# Patient Record
Sex: Male | Born: 1940 | Race: White | Hispanic: No | Marital: Married | Smoking: Former smoker
Health system: Southern US, Community
[De-identification: ages and names within clinical notes are randomized; demographics above are authoritative.]

## PROBLEM LIST (undated history)

## (undated) DIAGNOSIS — I447 Left bundle-branch block, unspecified: Secondary | ICD-10-CM

## (undated) DIAGNOSIS — I4891 Unspecified atrial fibrillation: Secondary | ICD-10-CM

## (undated) DIAGNOSIS — G4730 Sleep apnea, unspecified: Secondary | ICD-10-CM

## (undated) DIAGNOSIS — E785 Hyperlipidemia, unspecified: Secondary | ICD-10-CM

## (undated) DIAGNOSIS — R42 Dizziness and giddiness: Secondary | ICD-10-CM

## (undated) DIAGNOSIS — R351 Nocturia: Secondary | ICD-10-CM

## (undated) DIAGNOSIS — M1990 Unspecified osteoarthritis, unspecified site: Secondary | ICD-10-CM

## (undated) DIAGNOSIS — N210 Calculus in bladder: Secondary | ICD-10-CM

## (undated) DIAGNOSIS — I10 Essential (primary) hypertension: Secondary | ICD-10-CM

## (undated) DIAGNOSIS — E7800 Pure hypercholesterolemia, unspecified: Secondary | ICD-10-CM

## (undated) DIAGNOSIS — I429 Cardiomyopathy, unspecified: Secondary | ICD-10-CM

## (undated) HISTORY — DX: Hyperlipidemia, unspecified: E78.5

## (undated) HISTORY — DX: Dizziness and giddiness: R42

## (undated) HISTORY — DX: Unspecified atrial fibrillation: I48.91

## (undated) HISTORY — DX: Left bundle-branch block, unspecified: I44.7

## (undated) HISTORY — DX: Nocturia: R35.1

## (undated) HISTORY — PX: TOTAL HIP ARTHROPLASTY: SHX124

## (undated) HISTORY — PX: HERNIA REPAIR: SHX51

## (undated) HISTORY — DX: Unspecified osteoarthritis, unspecified site: M19.90

---

## 2000-10-31 ENCOUNTER — Encounter: Payer: Self-pay | Admitting: General Surgery

## 2000-11-07 ENCOUNTER — Ambulatory Visit (HOSPITAL_COMMUNITY): Admission: RE | Admit: 2000-11-07 | Discharge: 2000-11-07 | Payer: Self-pay | Admitting: General Surgery

## 2001-09-08 ENCOUNTER — Ambulatory Visit (HOSPITAL_COMMUNITY): Admission: RE | Admit: 2001-09-08 | Discharge: 2001-09-08 | Payer: Self-pay | Admitting: Cardiology

## 2002-10-05 ENCOUNTER — Ambulatory Visit (HOSPITAL_COMMUNITY): Admission: RE | Admit: 2002-10-05 | Discharge: 2002-10-05 | Payer: Self-pay | Admitting: Gastroenterology

## 2010-11-23 ENCOUNTER — Other Ambulatory Visit: Payer: Self-pay | Admitting: Orthopaedic Surgery

## 2010-11-23 ENCOUNTER — Encounter (HOSPITAL_COMMUNITY): Payer: BC Managed Care – PPO

## 2010-11-23 ENCOUNTER — Ambulatory Visit (HOSPITAL_COMMUNITY)
Admission: RE | Admit: 2010-11-23 | Discharge: 2010-11-23 | Disposition: A | Discharge status: Home / Self Care | Payer: BC Managed Care – PPO | Source: Ambulatory Visit | Attending: Orthopaedic Surgery | Admitting: Orthopaedic Surgery

## 2010-11-23 ENCOUNTER — Other Ambulatory Visit (HOSPITAL_COMMUNITY): Payer: Self-pay | Admitting: Orthopaedic Surgery

## 2010-11-23 DIAGNOSIS — M1610 Unilateral primary osteoarthritis, unspecified hip: Secondary | ICD-10-CM | POA: Insufficient documentation

## 2010-11-23 DIAGNOSIS — Z01810 Encounter for preprocedural cardiovascular examination: Secondary | ICD-10-CM | POA: Insufficient documentation

## 2010-11-23 DIAGNOSIS — Z01812 Encounter for preprocedural laboratory examination: Secondary | ICD-10-CM | POA: Insufficient documentation

## 2010-11-23 DIAGNOSIS — Z01811 Encounter for preprocedural respiratory examination: Secondary | ICD-10-CM

## 2010-11-23 DIAGNOSIS — Z01818 Encounter for other preprocedural examination: Secondary | ICD-10-CM | POA: Insufficient documentation

## 2010-11-23 DIAGNOSIS — R9431 Abnormal electrocardiogram [ECG] [EKG]: Secondary | ICD-10-CM | POA: Insufficient documentation

## 2010-11-23 DIAGNOSIS — Z79899 Other long term (current) drug therapy: Secondary | ICD-10-CM | POA: Insufficient documentation

## 2010-11-23 DIAGNOSIS — I10 Essential (primary) hypertension: Secondary | ICD-10-CM | POA: Insufficient documentation

## 2010-11-23 DIAGNOSIS — Z7982 Long term (current) use of aspirin: Secondary | ICD-10-CM | POA: Insufficient documentation

## 2010-11-23 DIAGNOSIS — M169 Osteoarthritis of hip, unspecified: Secondary | ICD-10-CM | POA: Insufficient documentation

## 2010-11-23 DIAGNOSIS — R7309 Other abnormal glucose: Secondary | ICD-10-CM | POA: Insufficient documentation

## 2010-11-23 LAB — BASIC METABOLIC PANEL
BUN: 21 mg/dL (ref 6–23)
CO2: 25 mEq/L (ref 19–32)
Calcium: 9.2 mg/dL (ref 8.4–10.5)
Chloride: 101 mEq/L (ref 96–112)
Creatinine, Ser: 1.03 mg/dL (ref 0.50–1.35)
GFR calc Af Amer: >60 mL/min (ref >60)
GFR calc non Af Amer: >60 mL/min (ref >60)
Glucose, Bld: 103 mg/dL — ABNORMAL HIGH (ref 70–99)
Potassium: 4 mEq/L (ref 3.5–5.1)
Sodium: 136 mEq/L (ref 135–145)

## 2010-11-23 LAB — URINALYSIS, ROUTINE W REFLEX MICROSCOPIC
Bilirubin Urine: NEGATIVE
Glucose, UA: NEGATIVE mg/dL
Hgb urine dipstick: NEGATIVE
Ketones, ur: NEGATIVE mg/dL
Leukocytes, UA: NEGATIVE
Nitrite: NEGATIVE
Protein, ur: NEGATIVE mg/dL
Specific Gravity, Urine: 1.022 (ref 1.005–1.030)
Urobilinogen, UA: 0.2 mg/dL (ref 0.0–1.0)
pH: 6.5 (ref 5.0–8.0)

## 2010-11-23 LAB — PROTIME-INR
INR: 1.1 (ref 0.00–1.49)
Prothrombin Time: 14.4 seconds (ref 11.6–15.2)

## 2010-11-23 LAB — DIFFERENTIAL
Basophils Absolute: 0 10*3/uL (ref 0.0–0.1)
Basophils Relative: 1 % (ref 0–1)
Eosinophils Absolute: 0.5 10*3/uL (ref 0.0–0.7)
Eosinophils Relative: 8 % — ABNORMAL HIGH (ref 0–5)
Lymphocytes Relative: 23 % (ref 12–46)
Lymphs Abs: 1.5 10*3/uL (ref 0.7–4.0)
Monocytes Absolute: 0.7 10*3/uL (ref 0.1–1.0)
Monocytes Relative: 10 % (ref 3–12)
Neutro Abs: 3.8 10*3/uL (ref 1.7–7.7)
Neutrophils Relative %: 59 % (ref 43–77)

## 2010-11-23 LAB — CBC
HCT: 41.1 % (ref 39.0–52.0)
Hemoglobin: 13.7 g/dL (ref 13.0–17.0)
MCH: 30.4 pg (ref 26.0–34.0)
MCHC: 33.3 g/dL (ref 30.0–36.0)
MCV: 91.3 fL (ref 78.0–100.0)
Platelets: 191 10*3/uL (ref 150–400)
RBC: 4.5 MIL/uL (ref 4.22–5.81)
RDW: 12.6 % (ref 11.5–15.5)
WBC: 6.6 10*3/uL (ref 4.0–10.5)

## 2010-11-30 ENCOUNTER — Inpatient Hospital Stay (HOSPITAL_COMMUNITY): Payer: BC Managed Care – PPO

## 2010-11-30 ENCOUNTER — Inpatient Hospital Stay (HOSPITAL_COMMUNITY)
Admission: RE | Admit: 2010-11-30 | Discharge: 2010-12-03 | DRG: 818 | Disposition: A | Discharge status: Home Health | Payer: BC Managed Care – PPO | Source: Ambulatory Visit | Attending: Orthopaedic Surgery | Admitting: Orthopaedic Surgery

## 2010-11-30 DIAGNOSIS — I10 Essential (primary) hypertension: Secondary | ICD-10-CM | POA: Diagnosis present

## 2010-11-30 DIAGNOSIS — M169 Osteoarthritis of hip, unspecified: Principal | ICD-10-CM | POA: Diagnosis present

## 2010-11-30 DIAGNOSIS — M1610 Unilateral primary osteoarthritis, unspecified hip: Principal | ICD-10-CM | POA: Diagnosis present

## 2010-11-30 DIAGNOSIS — I447 Left bundle-branch block, unspecified: Secondary | ICD-10-CM | POA: Diagnosis present

## 2010-11-30 DIAGNOSIS — D62 Acute posthemorrhagic anemia: Secondary | ICD-10-CM | POA: Diagnosis not present

## 2010-11-30 DIAGNOSIS — Z01812 Encounter for preprocedural laboratory examination: Secondary | ICD-10-CM

## 2010-11-30 DIAGNOSIS — Z96649 Presence of unspecified artificial hip joint: Secondary | ICD-10-CM

## 2010-11-30 LAB — ABO/RH: ABO/RH(D): O POS

## 2010-12-01 LAB — BASIC METABOLIC PANEL
CO2: 26 mEq/L (ref 19–32)
Calcium: 7.9 mg/dL — ABNORMAL LOW (ref 8.4–10.5)
Chloride: 102 mEq/L (ref 96–112)
Sodium: 134 mEq/L — ABNORMAL LOW (ref 135–145)

## 2010-12-01 LAB — CBC
Platelets: 142 10*3/uL — ABNORMAL LOW (ref 150–400)
RBC: 2.99 MIL/uL — ABNORMAL LOW (ref 4.22–5.81)
WBC: 9 10*3/uL (ref 4.0–10.5)

## 2010-12-02 LAB — BASIC METABOLIC PANEL
CO2: 27 mEq/L (ref 19–32)
Chloride: 98 mEq/L (ref 96–112)
Creatinine, Ser: 1.2 mg/dL (ref 0.50–1.35)

## 2010-12-02 LAB — CBC
HCT: 24.8 % — ABNORMAL LOW (ref 39.0–52.0)
MCV: 92.2 fL (ref 78.0–100.0)
Platelets: 135 10*3/uL — ABNORMAL LOW (ref 150–400)
RBC: 2.69 MIL/uL — ABNORMAL LOW (ref 4.22–5.81)
WBC: 8.1 10*3/uL (ref 4.0–10.5)

## 2010-12-03 LAB — BASIC METABOLIC PANEL
BUN: 22 mg/dL (ref 6–23)
CO2: 27 mEq/L (ref 19–32)
Chloride: 97 mEq/L (ref 96–112)
Creatinine, Ser: 1.33 mg/dL (ref 0.50–1.35)
Glucose, Bld: 120 mg/dL — ABNORMAL HIGH (ref 70–99)

## 2010-12-03 LAB — CBC
HCT: 23.9 % — ABNORMAL LOW (ref 39.0–52.0)
Hemoglobin: 8.2 g/dL — ABNORMAL LOW (ref 13.0–17.0)
MCV: 91.9 fL (ref 78.0–100.0)
RDW: 12.3 % (ref 11.5–15.5)
WBC: 9.4 10*3/uL (ref 4.0–10.5)

## 2010-12-04 LAB — TYPE AND SCREEN: Unit division: 0

## 2010-12-04 NOTE — H&P (Signed)
  NAMEMOURAD, CWIKLA NO.:  [id number]  MEDICAL RECORD NO.:  [id number]  LOCATION:  1607                         FACILITY:  [facility]  PHYSICIAN:  [name], M.D.DATE OF BIRTH:  [birth year]  DATE OF ADMISSION:  11/30/2010 DATE OF DISCHARGE:                             HISTORY & PHYSICAL   CHIEF COMPLAINT:  Right hip pain.  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 Chaney Malling and has had no problems at all with that hip.  His right hip has been having significant pain.  He has had decreased activities in the right hip.  It started to hurt him to the point where it is affecting his activities of daily living, is affecting sports.  He plays a lot of sports.  He is an otherwise healthy individual.  He is having a lot of groin pain as well.  He does which at this point to proceed with a total hip arthroplasty.  He understands the risks and benefits of this and what this involves and he is going to the operating room today for the surgery.  PAST MEDICAL HISTORY: 1. High blood pressure. 2. Arthritis.  MEDICATIONS:  Lisinopril, Coreg, Lipitor, aspirin.  ALLERGIES:  No known drug allergies.  SOCIAL HISTORY:  Again, he is a professor at [employer].  He does not smoke, and drinks alcohol occasionally.  REVIEW OF SYSTEMS:  Negative for chest pain, shortness of breath, fever, chills, nausea, and vomiting.  PHYSICAL EXAMINATION:  VITAL SIGNS:  He is afebrile with stable vital signs. GENERAL:  He is alert and oriented x3, in no acute distress. HEENT:  Normocephalic, atraumatic.  Pupils equal, round, and reactive to light. NECK:  Supple. LUNGS:  Clear to auscultation bilaterally. HEART:  Regular rate and rhythm. ABDOMEN:  Benign. EXTREMITIES:  His leg lengths appear near equal.  He has pain with internal and external rotation of the right hip.  X-rays  reviewed and show near end-stage arthritis of the right hip.  His left hip is showing eccentric wear of the polyethylene.  ASSESSMENT:  Osteoarthritis, right hip.  PLAN:  We will proceed with a right total hip arthroplasty today.  He again understands the risks and benefits of this and what it involves and he does wish to proceed with surgery.     [name], M.D.     CYB/MEDQ  D:  11/30/2010  T:  12/01/2010  Job:  [id number]  Electronically Signed by Doneen Poisson M.D. on 12/04/2010 12:26:59 PM

## 2010-12-04 NOTE — Discharge Summary (Signed)
  NAMESAKETH, DAUBERT NO.:  [id number]  MEDICAL RECORD NO.:  [id number]  LOCATION:  1607                         FACILITY:  [facility]  PHYSICIAN:  [name], M.D.DATE OF BIRTH:  07-15-1940  DATE OF ADMISSION:  11/30/2010 DATE OF DISCHARGE:  12/03/2010                              DISCHARGE SUMMARY   ADMITTING DIAGNOSIS:  Severe osteoarthritis and degenerative disease, right hip.  DISCHARGE DIAGNOSIS:  Severe osteoarthritis and degenerative disease, right hip.  PROCEDURE:  Right total hip arthroplasty through direct anterior approach on November 30, 2010.  HOSPITAL COURSE:  Mr. Hallowell is a 70 year old gentleman who has debilitating arthritis above his right hip.  He was admitted to the inpatient on November 30, 2010, after an elective total hip arthroplasty that was performed without complications.  Postoperatively on the floor, he did have an episode of acute blood loss anemia that did require transfusion.  Otherwise, his hospital course was uneventful.  By the day of discharge, he was tolerating a regular diet and oral pain medications and it was felt he could be discharged safely to home.  DISPOSITION:  Discharged to home.  DISCHARGE INSTRUCTIONS:  While he is at home, he can get his incision wet in shower.  He will follow up with my office in 2 weeks.  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  DISCHARGE MEDICATIONS: 1. Xarelto. 2. Percocet. 3. Robaxin. 4. He will continue on his same medications at home as before.     [name], M.D.     CYB/MEDQ  D:  12/03/2010  T:  12/03/2010  Job:  [id number]  Electronically Signed by Doneen Poisson M.D. on 12/04/2010 12:27:04 PM

## 2010-12-04 NOTE — Op Note (Signed)
NAMEVALENTINO, Cain NO.:  [id number]  MEDICAL RECORD NO.:  [id number]  LOCATION:  1607                         FACILITY:  [facility]  PHYSICIAN:  [name], M.D.DATE OF BIRTH:  10-30-1940  DATE OF PROCEDURE:  11/30/2010 DATE OF DISCHARGE:                              OPERATIVE REPORT   PREOPERATIVE DIAGNOSES:  Severe osteoarthritis and degenerative joint disease, right hip.  POSTOPERATIVE DIAGNOSES:  Severe osteoarthritis and degenerative joint disease, right hip.  PROCEDURE:  Right total hip arthroplasty through direct anterior approach.  IMPLANTS:  DePuy Pinnacle Sector cup/acetabular component size 58, size 10 Corail femoral component with collar and HA coating and standard offset, size 36 +1.5 ceramic femoral head, neutral +4 36 polyethylene liner.  SURGEON:  [name], M.D.  ANESTHESIA:  General.  ANTIBIOTICS:  2 g IV Ancef.  ESTIMATED BLOOD LOSS:  1000 cc.  COMPLICATIONS:  None.  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  He has had previous left total hip arthroplasty at least 15+ years ago.  The right has bothered him severely.  His x-rays showed end-stage arthritis and he wished to proceed with a total hip arthroplasty.  I explained the risks and benefits of the surgery to him including the risks, acute blood loss anemia, DVT, and PE, and the fact that I will be doing this under direct anterior approach.  He does understand this and wished to proceed with the surgery.  PROCEDURE DESCRIPTION:  After informed consent was obtained, appropriate right hip was marked.  He was brought to the operating room and general anesthesia was obtained while he was on a stretcher.  A Foley catheter was placed and then both legs were placed in traction boots.  He was then placed on the Hana fracture table with perineal post in place as well.  Both legs were  placed in traction, but no traction was applied. His right hip was then assessed under direct fluoroscopy and I put the C- arm unit to assess this hip center and assess the hip in general.  We then prepped the right hip with DuraPrep and sterile drapes.  A time-out was called to identify the correct patient and correct right hip.  I then made an incision 1 cm distal and 3 cm posterior to the anterosuperior iliac supine and carried this down the leg.  I dissected down to the tensor fascia lata and then I divided the tensor fascia lata longitudinally.  I proceeded with the direct anterior approach of the hip.  Cobra retractor was placed around the lateral neck and then up under the rectus femoris, Cobra retractor was placed medial.  I was able to divide the hip capsule after cauterizing the lateral femoral circumflex vessels.  Once the hip capsule was divided, there was a large amount of synovial fluid that was drained.  I then placed the Cobra retractors within the hip capsule under direct fluoroscopic guidance, I accessed my level neck cut, then I made the neck cut with an oscillating saw under direct visualization.  Using a corkscrew guide, I removed the femoral head in its entirety.  I then cleaned the  acetabulum of debris including the labrum remnants and placed a Hohmann retractor anterior and a Cobra posterior.  I then began reaming from size 46 up to size 57 reamer with all the last few reamers placed under direct fluoroscopic guidance.  I then chose a size 58 femoral component and then placed this under direct fluoroscopic guidance, again in direct visualization, and this is a 58 Pinnacle Sector cup with 3 screw holes, but I did not need to place the screw holes.  I felt comfortable anteversion and inclination based on visualization and the direct fluoroscopy. Attention was then turned to the femur.  No traction was applied to the leg.  The leg was externally rotated to 90 degrees,  extended and adducted down to leg.  This allowed me to gain access to the femoral canal.  I did release the lateral capsule and piriformis to bring the femur more anterior.  I then used a cookie cutter guide and then began broaching from a size 8 broach to a size 10, and surprisingly the size 10 was stable.  He had very strong cortices.  I placed a trial standard neck and a trail 36 +1.5 femoral head and reduced this into acetabulum, bring the leg back up flexed with traction and internal rotation.  I put him through internal and external rotation and it was stable.  There was minimal shuck.  Under direct fluoroscopic guidance, the leg lengths were near equal.  We then dislocate the hip, removed all trial components and placed the real HA-coated femoral component size 10 with a collar.  The real 36 + 1.5 femoral head as well.  We then reduced his back in the acetabulum that was stable.  I already placed the polyethylene liner in the socket as well.  Under direct fluoroscopic guidance, the hip again was stable.  We copiously irrigated the tissues.  Of note, he did have robust bleeding throughout the case.  He lost about 1000 cc of blood. We then closed the hip capsule with interrupted #1 Ethibond suture followed by reapproximated the tensor fascia lata with running #1 Vicryl suture, 2-0 in the subcutaneous tissue, and 4-0 Monocryl and subcuticular tissue with Steri-Strips applied.  Well padded sterile dressing was applied.  He was awakened, extubated, and taken to the recovery room in stable condition.  All final counts were correct and there were no complications noted.     [name], M.D.     CYB/MEDQ  D:  11/30/2010  T:  12/01/2010  Job:  [id number]  Electronically Signed by Doneen Poisson M.D. on 12/04/2010 12:27:02 PM

## 2014-02-16 ENCOUNTER — Ambulatory Visit (INDEPENDENT_AMBULATORY_CARE_PROVIDER_SITE_OTHER): Payer: BC Managed Care – PPO | Admitting: Emergency Medicine

## 2014-02-16 VITALS — BP 118/68 | HR 76 | Temp 98.0°F | Resp 17 | Ht 71.0 in | Wt 196.0 lb

## 2014-02-16 DIAGNOSIS — L255 Unspecified contact dermatitis due to plants, except food: Secondary | ICD-10-CM

## 2014-02-16 MED ORDER — TRIAMCINOLONE ACETONIDE 0.1 % EX CREA: 1.0000 "application " | TOPICAL_CREAM | Freq: Two times a day (BID) | CUTANEOUS | Status: DC

## 2014-02-16 NOTE — Patient Instructions (Signed)

## 2014-02-16 NOTE — Progress Notes (Signed)
[facility] [address], [place] [address] [phone]  Date:  02/16/2014   Name:  Daniel Cain   DOB:  Aug 22, 1940   MRN:  [id number]  PCP:  No primary provider on file.    Chief Complaint: Poison Ivy and Immunizations   History of Present Illness:  OSMIN WELZ is a 73 y.o. 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   Not responding to OTC measures as well as he would like. No improvement with over the counter medications or other home remedies. Denies other complaint or health concern today.   There are no active problems to display for this patient.   History reviewed. No pertinent past medical history.  History reviewed. No pertinent past surgical history.  History  Substance Use Topics  . Smoking status: Never Smoker   . Smokeless tobacco: Not on file  . Alcohol Use: No    Family History  Problem Relation Age of Onset  . Heart disease Mother   . Hypertension Father     No Known Allergies  Medication list has been reviewed and updated.  No current outpatient prescriptions on file prior to visit.   No current facility-administered medications on file prior to visit.    Review of Systems:  As per HPI, otherwise negative.    Physical Examination: Filed Vitals:   02/16/14 1458  BP: 118/68  Pulse: 76  Temp: 98 F (36.7 C)  Resp: 17   Filed Vitals:   02/16/14 1458  Height:  (1.803 m)  Weight: 196 lb (88.905 kg)   Body mass index is 27.35 kg/(m^2). Ideal Body Weight: Weight in (lb) to have BMI = 25: 178.9   GEN: WDWN, NAD, Non-toxic, Alert & Oriented x 3 HEENT: Atraumatic, Normocephalic.  Ears and Nose: No external deformity. EXTR: No clubbing/cyanosis/edema NEURO: Normal gait.  PSYCH: Normally interactive. Conversant. Not depressed or anxious appearing.  Calm demeanor.  LEGS:  Contact dermatitis   Assessment and Plan: Poison  ivy TAC  Signed,  Phillips Odor, MD

## 2014-07-16 ENCOUNTER — Encounter (HOSPITAL_COMMUNITY): Payer: Self-pay | Admitting: Emergency Medicine

## 2014-07-16 ENCOUNTER — Emergency Department (HOSPITAL_COMMUNITY)
Admission: EM | Admit: 2014-07-16 | Discharge: 2014-07-16 | Disposition: A | Discharge status: Home / Self Care | Payer: BC Managed Care – PPO | Attending: Emergency Medicine | Admitting: Emergency Medicine

## 2014-07-16 DIAGNOSIS — E780 Pure hypercholesterolemia: Secondary | ICD-10-CM | POA: Insufficient documentation

## 2014-07-16 DIAGNOSIS — R112 Nausea with vomiting, unspecified: Secondary | ICD-10-CM | POA: Diagnosis not present

## 2014-07-16 DIAGNOSIS — R531 Weakness: Secondary | ICD-10-CM

## 2014-07-16 DIAGNOSIS — R42 Dizziness and giddiness: Secondary | ICD-10-CM | POA: Diagnosis present

## 2014-07-16 DIAGNOSIS — I10 Essential (primary) hypertension: Secondary | ICD-10-CM | POA: Insufficient documentation

## 2014-07-16 DIAGNOSIS — Z79899 Other long term (current) drug therapy: Secondary | ICD-10-CM | POA: Diagnosis not present

## 2014-07-16 DIAGNOSIS — Z7982 Long term (current) use of aspirin: Secondary | ICD-10-CM | POA: Diagnosis not present

## 2014-07-16 HISTORY — DX: Pure hypercholesterolemia, unspecified: E78.00

## 2014-07-16 HISTORY — DX: Essential (primary) hypertension: I10

## 2014-07-16 LAB — CBC WITH DIFFERENTIAL/PLATELET
BASOS PCT: 0 % (ref 0–1)
Basophils Absolute: 0 10*3/uL (ref 0.0–0.1)
EOS ABS: 0.3 10*3/uL (ref 0.0–0.7)
EOS PCT: 4 % (ref 0–5)
HCT: 42.7 % (ref 39.0–52.0)
HEMOGLOBIN: 14.3 g/dL (ref 13.0–17.0)
LYMPHS ABS: 0.9 10*3/uL (ref 0.7–4.0)
Lymphocytes Relative: 13 % (ref 12–46)
MCH: 31.6 pg (ref 26.0–34.0)
MCHC: 33.5 g/dL (ref 30.0–36.0)
MCV: 94.5 fL (ref 78.0–100.0)
Monocytes Absolute: 0.5 10*3/uL (ref 0.1–1.0)
Monocytes Relative: 8 % (ref 3–12)
NEUTROS ABS: 5.3 10*3/uL (ref 1.7–7.7)
NEUTROS PCT: 75 % (ref 43–77)
PLATELETS: 197 10*3/uL (ref 150–400)
RBC: 4.52 MIL/uL (ref 4.22–5.81)
RDW: 12.2 % (ref 11.5–15.5)
WBC: 7 10*3/uL (ref 4.0–10.5)

## 2014-07-16 LAB — COMPREHENSIVE METABOLIC PANEL
ALBUMIN: 4.4 g/dL (ref 3.5–5.2)
ALK PHOS: 57 U/L (ref 39–117)
ALT: 24 U/L (ref 0–53)
AST: 25 U/L (ref 0–37)
Anion gap: 7 (ref 5–15)
BUN: 19 mg/dL (ref 6–23)
CALCIUM: 9.2 mg/dL (ref 8.4–10.5)
CHLORIDE: 104 mmol/L (ref 96–112)
CO2: 27 mmol/L (ref 19–32)
Creatinine, Ser: 1.02 mg/dL (ref 0.50–1.35)
GFR calc non Af Amer: 71 mL/min — ABNORMAL LOW (ref >90)
GFR, EST AFRICAN AMERICAN: 83 mL/min — AB (ref >90)
GLUCOSE: 123 mg/dL — AB (ref 70–99)
POTASSIUM: 4.3 mmol/L (ref 3.5–5.1)
SODIUM: 138 mmol/L (ref 135–145)
Total Bilirubin: 1.2 mg/dL (ref 0.3–1.2)
Total Protein: 6.7 g/dL (ref 6.0–8.3)

## 2014-07-16 LAB — TROPONIN I: Troponin I: <0.03 ng/mL (ref <0.031)

## 2014-07-16 NOTE — ED Notes (Signed)
Pt alert, oriented, and ambulatory upon DC. He was advised to limit his alcohol consumption and follow up with PCP Daniel Cain in the next week.

## 2014-07-16 NOTE — ED Notes (Signed)
MD at [facility] at bedside. 

## 2014-07-16 NOTE — ED Provider Notes (Addendum)
CSN: [id number]     Arrival date & time 07/16/14  1510 History   First MD Initiated Contact with Patient 07/16/14 1527     Chief Complaint  Patient presents with  . Dizziness  . Nausea     (Consider location/radiation/quality/duration/timing/severity/associated sxs/prior Treatment) HPI Patient awakened this morning at 9 AM feeling "foggy", meaning difficulty concentrating and lightheadedness and nauseated. He vomited one time. No hematemesis He no longer feels nauseated and "fogginess" feels improved. He denies shortness of breath denies chest pain denies abdominal pain denies headache no other associated symptoms. He's had 4 similar episodes in the past month. No prior medical treatment or evaluation for this complaint. Nothing makes symptoms better or worse. No other associated symptoms Past Medical History  Diagnosis Date  . Hypertension   . Hypercholesteremia    History reviewed. No pertinent past surgical history. Family History  Problem Relation Age of Onset  . Heart disease Mother   . Hypertension Father    History  Substance Use Topics  . Smoking status: Never Smoker   . Smokeless tobacco: Not on file  . Alcohol Use: Yes     Comment: social   no illicit drug use  Review of Systems  Gastrointestinal: Positive for nausea and vomiting.  Neurological: Positive for light-headedness.  All other systems reviewed and are negative.     Allergies  Review of patient's allergies indicates no known allergies.  Home Medications   Prior to Admission medications   Medication Sig Start Date End Date Taking? Authorizing Provider  aspirin 81 MG tablet Take 81 mg by mouth daily.    Historical Provider, MD  lisinopril (PRINIVIL,ZESTRIL) 10 MG tablet Take 10 mg by mouth daily.    Historical Provider, MD  rosuvastatin (CRESTOR) 10 MG tablet Take 10 mg by mouth daily.    Historical Provider, MD  triamcinolone cream (KENALOG) 0.1 % Apply 1 application topically 2 (two) times daily.  02/16/14   Carmelina DaneJeffery S Anderson, MD   BP 156/84 mmHg  Pulse 55  Temp(Src) 97.6 F (36.4 C) (Oral)  Resp 18  SpO2 100% Physical Exam  Constitutional: He is oriented to person, place, and time. He appears well-developed and well-nourished. No distress.  HENT:  Head: Normocephalic and atraumatic.  Eyes: Conjunctivae are normal. Pupils are equal, round, and reactive to light.  Neck: Neck supple. No tracheal deviation present. No thyromegaly present.  Cardiovascular: Normal rate, regular rhythm and normal heart sounds.   No murmur heard. Pulmonary/Chest: Effort normal and breath sounds normal.  Abdominal: Soft. Bowel sounds are normal. He exhibits no distension and no mass. There is no tenderness.  Musculoskeletal: Normal range of motion. He exhibits no edema or tenderness.  Neurological: He is alert and oriented to person, place, and time. No cranial nerve deficit. Coordination normal.  Motor strength 5 over 5 overall gait normal  Skin: Skin is warm and dry. No rash noted.  Psychiatric: He has a normal mood and affect.  Nursing note and vitals reviewed.   ED Course  Procedures (including critical care time) Labs Review Labs Reviewed  I-STAT CHEM 8, ED    Imaging Review No results found.   EKG Interpretation   Date/Time:  Saturday July 16 2014 15:43:54 EST Ventricular Rate:  53 PR Interval:  152 QRS Duration: 154 QT Interval:  496 QTC Calculation: 466 R Axis:   41 Text Interpretation:  Sinus rhythm Left bundle branch block No significant  change since last tracing Confirmed by Mirian MoGentry, Matthew [phone]([id number]) on  07/16/2014 3:46:46 PM     4:55 PM patient resting comfortably. Asymptomatic Glasgow Coma Score 15 Results for orders placed or performed during the hospital encounter of 07/16/14  Comprehensive metabolic panel  Result Value Ref Range   Sodium 138 135 - 145 mmol/L   Potassium 4.3 3.5 - 5.1 mmol/L   Chloride 104 96 - 112 mmol/L   CO2 27 19 - 32 mmol/L   Glucose,  Bld 123 (H) 70 - 99 mg/dL   BUN 19 6 - 23 mg/dL   Creatinine, Ser 6.04 0.50 - 1.35 mg/dL   Calcium 9.2 8.4 - 54.0 mg/dL   Total Protein 6.7 6.0 - 8.3 g/dL   Albumin 4.4 3.5 - 5.2 g/dL   AST 25 0 - 37 U/L   ALT 24 0 - 53 U/L   Alkaline Phosphatase 57 39 - 117 U/L   Total Bilirubin 1.2 0.3 - 1.2 mg/dL   GFR calc non Af Amer 71 (L) >90 mL/min   GFR calc Af Amer 83 (L) >90 mL/min   Anion gap 7 5 - 15  CBC with Differential/Platelet  Result Value Ref Range   WBC 7.0 4.0 - 10.5 K/uL   RBC 4.52 4.22 - 5.81 MIL/uL   Hemoglobin 14.3 13.0 - 17.0 g/dL   HCT 98.1 19.1 - 47.8 %   MCV 94.5 78.0 - 100.0 fL   MCH 31.6 26.0 - 34.0 pg   MCHC 33.5 30.0 - 36.0 g/dL   RDW 29.5 62.1 - 30.8 %   Platelets 197 150 - 400 K/uL   Neutrophils Relative % 75 43 - 77 %   Neutro Abs 5.3 1.7 - 7.7 K/uL   Lymphocytes Relative 13 12 - 46 %   Lymphs Abs 0.9 0.7 - 4.0 K/uL   Monocytes Relative 8 3 - 12 %   Monocytes Absolute 0.5 0.1 - 1.0 K/uL   Eosinophils Relative 4 0 - 5 %   Eosinophils Absolute 0.3 0.0 - 0.7 K/uL   Basophils Relative 0 0 - 1 %   Basophils Absolute 0.0 0.0 - 0.1 K/uL  Troponin I  Result Value Ref Range   Troponin I <0.03 <0.031 ng/mL   No results found.  MDM  I doubt cardiac etiology patient reports that he plays basketball 3 times per week without exertional dyspnea. His exercise tolerance is not diminished. Patient and his wife disagree on how much he drinks. His wife states that he grossly under estimates his alcohol use. Patient admits to drinking alcohol approximately 2 glasses of wine 2 or 3 times per week. His wife states his alcohol use is much heavier, they have argued over his alcohol use and he drank heavily last night. I suggest the patient and his wife have a conversation with Dr. Georgia Dom alcohol use and he cut down on alcohol use. He may also benefit from exercise stress test. He has an appointment with Dr. Modesto Charon next week Final diagnoses:  None  Dx.#1 Weakness #2 nausea  and vomiting      Doug Sou, MD 07/16/14 1701  Doug Sou, MD 07/16/14 [phone]

## 2014-07-16 NOTE — ED Notes (Addendum)
Pt from home c/o episodes of lightheadedness and nausea for the past couple of month.  Pt denies blurred vision, headache, or pain of any type. Pt alert, oriented, and ambulatory with steady gait upon triage assessment.

## 2014-07-16 NOTE — Discharge Instructions (Signed)
Keep your scheduled appointment with Dr. Modesto CharonWong next week. We suggest that you limit alcohol use to 4 ounces of wine or one beer daily. We also suggest that you.ask Dr. Modesto CharonWong for cardiac stress test. Return if you feel worse for any reason

## 2014-08-29 NOTE — Progress Notes (Signed)
     HPI: 74 year old male for evaluation of weakness. Seen recently in the emergency room for dizziness. Laboratories in February 2016 showed BUN 19 and creatinine 1.02. Potassium 4.3. Hemoglobin 14.3. Patient denies dyspnea on exertion, orthopnea, PND, pedal edema, syncope or chest pain. Since January he has had 3 separate episodes of dizziness. He states it is not positional. It occurs suddenly and it is described as "being in a fog". No chest pain, palpitations or syncope. Symptoms last 1 hour and resolved. No associated weakness in his extremities, dysarthria.  Current Outpatient Prescriptions  Medication Sig Dispense Refill  . aspirin 81 MG tablet Take 81 mg by mouth daily.    Marland Kitchen. atorvastatin (LIPITOR) 80 MG tablet Take 1 tablet by mouth daily.    . carvedilol (COREG) 25 MG tablet Take 1 tablet by mouth 2 (two) times daily.    . clobetasol cream (TEMOVATE) 0.05 % Apply 1 application topically 2 (two) times daily.    Marland Kitchen. lisinopril (PRINIVIL,ZESTRIL) 20 MG tablet Take 1 tablet by mouth daily.     No current facility-administered medications for this visit.    No Known Allergies   Past Medical History  Diagnosis Date  . Hypertension   . Hypercholesteremia     Past Surgical History  Procedure Laterality Date  . Total hip arthroplasty Bilateral   . Hernia repair      History   Social History  . Marital Status: Married    Spouse Name: N/A  . Number of Children: 3  . Years of Education: N/A   Occupational History  . Not on file.   Social History Main Topics  . Smoking status: Former Games developermoker  . Smokeless tobacco: Not on file  . Alcohol Use: 0.0 oz/week    0 Standard drinks or equivalent per week     Comment: 1-2 glasses wine per day  . Drug Use: No  . Sexual Activity: No   Other Topics Concern  . Not on file   Social History Narrative    Family History  Problem Relation Age of Onset  . COPD Mother   . Hypertension Father     ROS: no fevers or chills,  productive cough, hemoptysis, dysphasia, odynophagia, melena, hematochezia, dysuria, hematuria, rash, seizure activity, orthopnea, PND, pedal edema, claudication. Remaining systems are negative.  Physical Exam:   Blood pressure 142/90, pulse 66, height 6' (1.829 m), weight 194 lb 14.4 oz (88.406 kg).  General:  Well developed/well nourished in NAD Skin warm/dry Patient not depressed No peripheral clubbing Back-normal HEENT-normal/normal eyelids Neck supple/normal carotid upstroke bilaterally; no bruits; no JVD; no thyromegaly chest - CTA/ normal expansion CV - RRR/normal S1 and S2; no murmurs, rubs or gallops;  PMI nondisplaced Abdomen -NT/ND, no HSM, no mass, + bowel sounds, no bruit 2+ femoral pulses, no bruits Ext-no edema, chords, 2+ DP Neuro-grossly nonfocal  ECG 07/16/2014-sinus bradycardia, left bundle branch block.

## 2014-08-30 ENCOUNTER — Encounter: Payer: Self-pay | Admitting: *Deleted

## 2014-08-30 ENCOUNTER — Encounter: Payer: Self-pay | Admitting: Cardiology

## 2014-08-30 ENCOUNTER — Ambulatory Visit (INDEPENDENT_AMBULATORY_CARE_PROVIDER_SITE_OTHER): Payer: BC Managed Care – PPO | Admitting: Cardiology

## 2014-08-30 VITALS — BP 142/90 | HR 66 | Ht 72.0 in | Wt 194.9 lb

## 2014-08-30 DIAGNOSIS — E785 Hyperlipidemia, unspecified: Secondary | ICD-10-CM | POA: Diagnosis not present

## 2014-08-30 DIAGNOSIS — I10 Essential (primary) hypertension: Secondary | ICD-10-CM

## 2014-08-30 DIAGNOSIS — R42 Dizziness and giddiness: Secondary | ICD-10-CM | POA: Diagnosis not present

## 2014-08-30 NOTE — Patient Instructions (Signed)
Your physician wants you to follow-up in: 3 MONTHS WITH DR Jens SomRENSHAW You will receive a reminder letter in the mail two months in advance. If you don't receive a letter, please call our office to schedule the follow-up appointment.   Your physician has requested that you have an adenosine myoview. For further information please visit [email or web address]. Please follow instruction sheet, as given.

## 2014-08-30 NOTE — Assessment & Plan Note (Signed)
Etiology unclear. He did not have syncope. Symptoms were prolonged. He is concerned about the possibility of coronary disease. Will arrange nuclear study for risk stratification particularly given left bundle branch block. We will obtain records as he apparently had a cardiac catheterization years ago by Dr. Mayford Knifeurner. Question if episodes were related to blood pressure. His blood pressure was apparently low and his primary care physician decreased his medications. He has had no symptoms since.

## 2014-08-30 NOTE — Assessment & Plan Note (Signed)
Continue statin. 

## 2014-08-30 NOTE — Assessment & Plan Note (Signed)
Continue present medications. 

## 2014-09-12 ENCOUNTER — Other Ambulatory Visit (HOSPITAL_COMMUNITY): Payer: Self-pay | Admitting: Cardiology

## 2014-09-12 DIAGNOSIS — R42 Dizziness and giddiness: Secondary | ICD-10-CM

## 2014-09-15 ENCOUNTER — Ambulatory Visit: Payer: BC Managed Care – PPO | Admitting: Cardiology

## 2014-09-16 ENCOUNTER — Telehealth (HOSPITAL_COMMUNITY): Payer: Self-pay

## 2014-09-16 NOTE — Telephone Encounter (Signed)
Encounter complete. 

## 2014-09-16 NOTE — Telephone Encounter (Signed)
UTR Encounter complete. 

## 2014-09-20 ENCOUNTER — Encounter (HOSPITAL_COMMUNITY): Payer: BC Managed Care – PPO

## 2014-09-21 ENCOUNTER — Telehealth (HOSPITAL_COMMUNITY): Payer: Self-pay

## 2014-09-21 NOTE — Telephone Encounter (Signed)
Encounter complete. 

## 2014-09-22 ENCOUNTER — Ambulatory Visit (HOSPITAL_COMMUNITY)
Admission: RE | Admit: 2014-09-22 | Discharge: 2014-09-22 | Disposition: A | Discharge status: Home / Self Care | Payer: BC Managed Care – PPO | Source: Ambulatory Visit | Attending: Cardiology | Admitting: Cardiology

## 2014-09-22 DIAGNOSIS — R42 Dizziness and giddiness: Secondary | ICD-10-CM | POA: Insufficient documentation

## 2014-09-22 DIAGNOSIS — Z8249 Family history of ischemic heart disease and other diseases of the circulatory system: Secondary | ICD-10-CM | POA: Insufficient documentation

## 2014-09-22 DIAGNOSIS — I10 Essential (primary) hypertension: Secondary | ICD-10-CM | POA: Insufficient documentation

## 2014-09-22 DIAGNOSIS — I447 Left bundle-branch block, unspecified: Secondary | ICD-10-CM | POA: Insufficient documentation

## 2014-09-22 DIAGNOSIS — Z87891 Personal history of nicotine dependence: Secondary | ICD-10-CM | POA: Diagnosis not present

## 2014-09-22 MED ORDER — TECHNETIUM TC 99M SESTAMIBI GENERIC - CARDIOLITE
10.7000 | Freq: Once | INTRAVENOUS | Status: AC | PRN
  Administered 2014-09-22: 11 via INTRAVENOUS

## 2014-09-22 MED ORDER — TECHNETIUM TC 99M SESTAMIBI GENERIC - CARDIOLITE
31.9000 | Freq: Once | INTRAVENOUS | Status: AC | PRN
  Administered 2014-09-22: 31.9 via INTRAVENOUS

## 2014-09-22 MED ORDER — REGADENOSON 0.4 MG/5ML IV SOLN
0.4000 mg | Freq: Once | INTRAVENOUS | Status: AC
  Administered 2014-09-22: 0.4 mg via INTRAVENOUS

## 2014-09-22 NOTE — Procedures (Addendum)
[facility] [address] [place] [address] [phone]  Cardiology Nuclear Med Study  Daniel Cain is a 74 y.o. male     MRN : [id number]     DOB: Mar 11, 1941  Procedure Date: 09/22/2014  Nuclear Med Background Indication for Stress Test:  Post Hospital History:  LBB;No further cardiac history reported; No prior respiratory history reported;No prior NUC MPI for comparison. Cardiac Risk Factors: Family History - CAD, History of Smoking, Hypertension, LBBB and Lipids  Symptoms:  Dizziness and Light-Headedness   Nuclear Pre-Procedure Caffeine/Decaff Intake:  1:30am NPO After: 9:30am   IV Site: R Forearm  IV 0.9% NS with Angio Cath:  22g  Chest Size (in):  40" IV Started by: Reece LeaderAmanda Robbins, RN  Height: 6' (1.829 m)  Cup Size: n/a  BMI:  Body mass index is 26.31 kg/(m^2). Weight:  194 lb (87.998 kg)   Tech Comments:  n/a    Nuclear Med Study 1 or 2 day study: 1 day  Stress Test Type:  Lexiscan  Order Authorizing Provider:  Olga MillersBrian Crenshaw, MD   Resting Radionuclide: Technetium 7758m Sestamibi  Resting Radionuclide Dose: 10.7 mCi   Stress Radionuclide:  Technetium 3258m Sestamibi  Stress Radionuclide Dose: 31.9 mCi           Stress Protocol Rest HR: 51 Stress HR: 64  Rest BP: 139/82 Stress BP: 144/86  Exercise Time (min): n/a METS: n/a          Dose of Adenosine (mg):  n/a Dose of Lexiscan: 0.4 mg  Dose of Atropine (mg): n/a Dose of Dobutamine: n/a mcg/kg/min (at max HR)  Stress Test Technologist: Esperanza Sheetserry-Marie Martin, CCT Nuclear Technologist:Elizabeth Young,CNMT   Rest Procedure:  Myocardial perfusion imaging was performed at rest 45 minutes following the intravenous administration of Technetium 5758m Sestamibi. Stress Procedure:  The patient received IV Lexiscan 0.4 mg over 15-seconds.  Technetium 2758m Sestamibi injected IV at 30-seconds.  There were no significant changes with Lexiscan.  Quantitative spect images  were obtained after a 45 minute delay.  Transient Ischemic Dilatation (Normal <1.22):  1.25  QGS EDV:  146 ml QGS ESV:  79 ml LV Ejection Fraction: 46%   PHYSICIAN INTERPRETATION  Rest ECG: NSR-LBBB  Stress ECG: No significant change from baseline ECG and Uninteretable due to baseline LBBB  QPS Raw Data Images:  Mild diaphragmatic attenuation.  Normal left ventricular size. Stress Images:  There is a moderate sized, medium intensity fixed perfusion defect in thte inferoseptal wall (basal to apical) - with no reversibility. Rest Images:  Comparison with the stress images reveals no significant change.  No reversibility Subtraction (SDS):  There is a fixed inferoseptal perfusion defect that in likely consistent with LBBB wall motion mediated defect, but cannot exclude distal LAD (or rPDA) infarct with no peri-infarct ischemia  Impression Exercise Capacity:  Lexiscan with no exercise. BP Response:  Normal blood pressure response. Clinical Symptoms:  felt flushed & funny ECG Impression:  Baseline:  LBBB.  EKG uninterpretable due to LBBB at rest and stress. Comparison with Prior Nuclear Study: No images to compare  Overall Impression:  Low risk stress nuclear study With a moderate fixed perfusion defect in the inferoseptal wall that is most consistent with LBBB septal wall motion, but cannot exclude distal LAD (or rPDA) infarction.  .  LV Wall Motion:  Mildly reduced LV function with LBBB related septal dyskinesis   [name], Piedad ClimesAVID W, MD  09/22/2014 6:29 PM

## 2014-09-27 ENCOUNTER — Encounter (HOSPITAL_COMMUNITY): Payer: BC Managed Care – PPO

## 2015-04-27 ENCOUNTER — Ambulatory Visit (INDEPENDENT_AMBULATORY_CARE_PROVIDER_SITE_OTHER): Payer: BC Managed Care – PPO | Admitting: Neurology

## 2015-04-27 ENCOUNTER — Encounter: Payer: Self-pay | Admitting: Neurology

## 2015-04-27 VITALS — BP 122/76 | HR 55 | Ht 72.0 in | Wt 199.2 lb

## 2015-04-27 DIAGNOSIS — I10 Essential (primary) hypertension: Secondary | ICD-10-CM | POA: Diagnosis not present

## 2015-04-27 DIAGNOSIS — E785 Hyperlipidemia, unspecified: Secondary | ICD-10-CM

## 2015-04-27 DIAGNOSIS — R42 Dizziness and giddiness: Secondary | ICD-10-CM

## 2015-04-27 NOTE — Patient Instructions (Addendum)
-   continue ASA and lipitor for stroke prevention - will do MRI brain with and without contrast - will do MR angiogram to rule out vascular lesion - will do EEG - will discuss about further possibilities pending on above tests - follow up with ENT and PCP as scheduled - avoid fall during the episode - follow up in one month.

## 2015-04-27 NOTE — Progress Notes (Signed)
NEUROLOGY CLINIC NEW PATIENT NOTE  NAME: Daniel Cain DOB: 11-Jun-1940 REFERRING PHYSICIAN: Ileana Ladd, MD  I saw Daniel Cain as a new consult in the neurovascular clinic today regarding  Chief Complaint  Patient presents with  . Referral    from Dr.Little  .  HPI: Daniel Cain is a 74 y.o. male with PMH of HTN and HLD who presents as a new patient for dizziness.   He had ER visit on 07/16/14 for episodic acute onset dizziness, foggy feeling in head, difficulty concentration and lightheadedness and N/V. Lasted about one hour and resolved. He had 4 similar episodes one month prior. No chest pain, palpitation, weakness or syncope. Had cardiology follow up with Dr. Jens Som in 08/2013, had nuclear stress study showed no ischemia.   During the summertime, he had no recurrent episodes. However on 02/09/2015, he had similar episode again, he got up to bathroom, had nausea vomiting, has to lie down, resolved in about 4 hours.  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  About 2 weeks ago on 04/15/2015, he was cooking in the back in the afternoon, similar episode started again, no nausea vomiting, mild spinning, he has to lie down and sleep for 12 hours. The second day he was back to his baseline.  Over this episode, he denies any headache, denies migraine history, no weakness, numbness, loss of consciousness, or hearing loss. He admits that recently he has some tinnitus in both ears, however mild, right more than left.  He went to see ENT Dr. 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 There is no evidence of central vision. Dix-Hallpike and roll tests were negative,  showing no indication of BPPV. He was referred here for further dilation.  Past Medical History  Diagnosis Date  . Hypertension   . Hypercholesteremia   . Vertigo   . Hyperlipidemia   . Left bundle branch block   . Osteoarthritis   . Nocturia    Past Surgical History  Procedure Laterality Date  . Total hip arthroplasty Bilateral   . Hernia repair     Family History  Problem Relation Age of Onset  . COPD Mother   . Stroke Mother   . Hypertension Father    Current Outpatient Prescriptions  Medication Sig Dispense Refill  . aspirin 81 MG tablet Take 81 mg by mouth daily.    Marland Kitchen atorvastatin (LIPITOR) 80 MG tablet Take 1 tablet by mouth daily.    . carvedilol (COREG) 25 MG tablet Take 1 tablet by mouth 2 (two) times daily.    . clobetasol cream (TEMOVATE) 0.05 % Apply 1 application topically 2 (two) times daily.    Marland Kitchen lisinopril (PRINIVIL,ZESTRIL) 20 MG tablet Take 1 tablet by mouth daily.     No current facility-administered medications for this visit.   No Known Allergies Social History   Social History  . Marital Status: Married    Spouse Name: N/A  . Number of Children: 3  . Years of Education: N/A   Occupational History  . Not on file.   Social History Main Topics  . Smoking status: Former Games developer  . Smokeless tobacco: Not on file  . Alcohol Use: 0.6 oz/week  0 Standard drinks or equivalent, 1 Glasses of wine per week     Comment: 1-2 glasses wine per day  . Drug Use: No  . Sexual Activity: No   Other Topics Concern  . Not on file   Social History Narrative    Review of Systems Full 14 system review of systems performed and notable only for those listed, all others are neg:  Constitutional:   Cardiovascular:  Ear/Nose/Throat:  Ringing years Skin:  Eyes:   Respiratory:   Gastroitestinal:   Genitourinary:  Hematology/Lymphatic:   Endocrine:  Musculoskeletal:   Allergy/Immunology:   Neurological:  Dizziness Psychiatric:  Sleep:    Physical  Exam  Filed Vitals:   04/27/15 1547  BP: 122/76  Pulse: 55    General - Well nourished, well developed, in no apparent distress.  Ophthalmologic - Sharp disc margins OU.  Cardiovascular - Regular rate and rhythm with no murmur.    Neck - supple, no nuchal rigidity .  Mental Status -  Level of arousal and orientation to time, place, and person were intact. Language including expression, naming, repetition, comprehension, reading, and writing was assessed and found intact. Attention span and concentration were normal. Recent and remote memory were intact. Fund of Knowledge was assessed and was intact.  Cranial Nerves II - XII - II - Visual field intact OU. III, IV, VI - Extraocular movements intact. V - Facial sensation intact bilaterally. VII - Facial movement intact bilaterally. VIII - Hearing & vestibular intact bilaterally. Weber's test in the middle, rinnie's test normal on the left side, decreased air conduction on the right. X - Palate elevates symmetrically. XI - Chin turning & shoulder shrug intact bilaterally. XII - Tongue protrusion intact.  Motor Strength - The patient's strength was normal in all extremities and pronator drift was absent.  Bulk was normal and fasciculations were absent.   Motor Tone - Muscle tone was assessed at the neck and appendages and was normal.  Reflexes - The patient's reflexes were normal in all extremities and he had no pathological reflexes.  Sensory - Light touch, temperature/pinprick, vibration and proprioception, and Romberg testing were assessed and were normal.    Coordination - The patient had normal movements in the hands and feet with no ataxia or dysmetria.  Tremor was absent.  Gait and Station - The patient's transfers, posture, gait, station, and turns were observed as normal.  Dix-Hallpike negative  Imaging and labs  Stress test 09/22/2014- Low risk stress nuclear study With a moderate fixed perfusion defect in the  inferoseptal wall that is most consistent with LBBB septal wall motion, but cannot exclude distal LAD (or rPDA) infarction. Marland Kitchen  VNG - abnormal.  The results indicating a right caloric weakness, which is most Likely consistent with a right sided peripheral vestibular lesion. There is no evidence of a central lesion. Dix-Hallpike and roll tests were negative, showing no indication of BPPV. No spontaneous or gaze nystagmus was seen. No significant positional nystagmus was evident. Tracking and OPK abilities were normal. Saccades were normal. Results of caloric irrigations are consistent with a strong caloric weakness in the right year. No significant failure of fixation suppression was not evident on caloric tracings    Assessment and Plan:   In summary, Daniel Cain is a 74 y.o. male with PMH of hypertension, hyperlipidemia presents multiple episodes of acute onset lightheadedness, imbalance, nausea vomiting. Lasting from 30 mins to 12 hours. During the interval time, patient is at baseline. No associated headache,  weakness, loss of consciousness. VNG indicates right-sided peripheral vestibular lesion. Exam also showed right-sided rinnie's test decreased air conduction. Etiology unclear, not typical for vascular disease and seizure. Will need to rule out brain tumor especially acoustic neuroma. Also will do MRA head and neck to evaluate vasculature. Will check EEG to rule out seizure.  - continue ASA and lipitor for stroke prevention  - will do MRI brain with and without contrast to rule out brain tumor - will do MRA head and neck to evaluate posterior circulation - will do EEG to evaluate seizure - follow up with ENT and PCP as scheduled - follow up in one month.  Thank you very much for the opportunity to participate in the care of this patient.  Please do not hesitate to call if any questions or concerns arise.  Orders Placed This Encounter  Procedures  . MR Brain W Wo Contrast     Standing Status: Future     Number of Occurrences:      Standing Expiration Date: 06/28/2016    Order Specific Question:  Reason for Exam (SYMPTOM  OR DIAGNOSIS REQUIRED)    Answer:  dizziness    Order Specific Question:  Preferred imaging location?    Answer:  Internal    Order Specific Question:  Does the patient have a pacemaker or implanted devices?    Answer:  No    Order Specific Question:  What is the patient's sedation requirement?    Answer:  No Sedation  . MR MRA HEAD WO CONTRAST    Standing Status: Future     Number of Occurrences:      Standing Expiration Date: 06/28/2016    Order Specific Question:  Reason for Exam (SYMPTOM  OR DIAGNOSIS REQUIRED)    Answer:  dizziness    Order Specific Question:  Preferred imaging location?    Answer:  Internal    Order Specific Question:  Does the patient have a pacemaker or implanted devices?    Answer:  No    Order Specific Question:  What is the patient's sedation requirement?    Answer:  No Sedation  . MR Angiogram Neck W Contrast    Standing Status: Future     Number of Occurrences:      Standing Expiration Date: 06/27/2016    Order Specific Question:  If indicated for the ordered procedure, I authorize the administration of contrast media per Radiology protocol    Answer:  Yes    Order Specific Question:  Reason for Exam (SYMPTOM  OR DIAGNOSIS REQUIRED)    Answer:  dizziness    Order Specific Question:  Preferred imaging location?    Answer:  Internal    Order Specific Question:  Does the patient have a pacemaker or implanted devices?    Answer:  No    Order Specific Question:  What is the patient's sedation requirement?    Answer:  No Sedation  . EEG adult    Standing Status: Future     Number of Occurrences:      Standing Expiration Date: 04/26/2016    No orders of the defined types were placed in this encounter.    Patient Instructions  - continue ASA and lipitor for stroke prevention - will do MRI brain with and  without contrast - will do MR angiogram to rule out vascular lesion - will do EEG - will discuss about further possibilities pending on above tests - follow up with ENT and PCP as scheduled -  avoid fall during the episode - follow up in one month.    Marvel Plan, MD PhD [facility] [address], [address] [place], [place] [address] [phone]

## 2015-05-08 ENCOUNTER — Telehealth: Payer: Self-pay | Admitting: Neurology

## 2015-05-08 NOTE — Telephone Encounter (Signed)
Pt's wife called sts she was told by a provider when pt has these episodes to bring him to office. She does not remember which provider told her this. I explained Dr Roda ShuttersXu is in the office every other wk and will be out of the office approx. 3 wks the end of Dec and into January so it would be hard for pt to be seen at this office. Please call and advise.

## 2015-05-08 NOTE — Telephone Encounter (Signed)
Rn call patients wife Daniel JohnsKathleen back about her husband having episodes. Pt is schedule to have EEG and MRA this week. Rn explain that patient was just seen by Dr.Xu last week for dizzy spells. DR.Xu did say in his last AVS to see PCP and ENT for follow up. Pts wife her husband to be work in or monitor while he have these episodes. Rn stated Dr. Roda ShuttersXu will call her back.

## 2015-05-08 NOTE — Telephone Encounter (Signed)
Discussed with patient and wife over the phone. Wife stated that she had another episode last Thursday, and now resolved. Wife wondering if he should be monitored in doctor's office if the episode happens again.  I told wife that if episode happens again, he should go to ER for further evaluation. ER evaluation during the episode will provide more information on physical exam and expedite the workup. It seems that episode happened more frequent than before and so far all the workup has not done. Patient has scheduled MRI and EEG next week.   Wife expressed appreciation and understanding.  Marvel PlanJindong Ariana Cavenaugh, MD PhD Stroke Neurology 05/08/2015 6:02 PM

## 2015-05-09 ENCOUNTER — Ambulatory Visit
Admission: RE | Admit: 2015-05-09 | Discharge: 2015-05-09 | Disposition: A | Discharge status: Home / Self Care | Payer: BC Managed Care – PPO | Source: Ambulatory Visit | Attending: Neurology | Admitting: Neurology

## 2015-05-09 DIAGNOSIS — R42 Dizziness and giddiness: Secondary | ICD-10-CM

## 2015-05-09 MED ORDER — GADOBENATE DIMEGLUMINE 529 MG/ML IV SOLN
18.0000 mL | Freq: Once | INTRAVENOUS | Status: AC | PRN
  Administered 2015-05-09: 18 mL via INTRAVENOUS

## 2015-05-11 ENCOUNTER — Telehealth: Payer: Self-pay | Admitting: Neurology

## 2015-05-11 NOTE — Telephone Encounter (Signed)
Rn call patient wifes [name] about her husbands MRi results. Rn explain to wife that the MRI results were just done last night by another MD. Rn stated that Dr. Roda ShuttersXu has to look over the MRI and give the results if its abnormal or normal. Pts husband would like a copy of the results. Rn stated pt will have to sign a release form for the results. Pt will come by tomorrow at 0930 while he is having his EEG.

## 2015-05-11 NOTE — Telephone Encounter (Signed)
Rn talk to patients wife Daniel JohnsKathleen about seeing pt tomorrow for MRI results at 0900. Pt has EEG at 0830 with Karin GoldenLorraine, and it will take 60 min. Rn explain pt may get seen by 0915 or 0930.

## 2015-05-11 NOTE — Telephone Encounter (Signed)
Wife called to get MRI results

## 2015-05-12 ENCOUNTER — Encounter: Payer: Self-pay | Admitting: Neurology

## 2015-05-12 ENCOUNTER — Ambulatory Visit (INDEPENDENT_AMBULATORY_CARE_PROVIDER_SITE_OTHER): Payer: BC Managed Care – PPO | Admitting: Neurology

## 2015-05-12 VITALS — BP 135/87 | HR 55 | Ht 72.0 in | Wt 192.0 lb

## 2015-05-12 DIAGNOSIS — E785 Hyperlipidemia, unspecified: Secondary | ICD-10-CM | POA: Diagnosis not present

## 2015-05-12 DIAGNOSIS — I781 Nevus, non-neoplastic: Secondary | ICD-10-CM | POA: Diagnosis not present

## 2015-05-12 DIAGNOSIS — R42 Dizziness and giddiness: Secondary | ICD-10-CM

## 2015-05-12 DIAGNOSIS — R410 Disorientation, unspecified: Secondary | ICD-10-CM | POA: Diagnosis not present

## 2015-05-12 DIAGNOSIS — I10 Essential (primary) hypertension: Secondary | ICD-10-CM | POA: Diagnosis not present

## 2015-05-12 MED ORDER — OXCARBAZEPINE 300 MG PO TABS: 300.0000 mg | ORAL_TABLET | Freq: Two times a day (BID) | ORAL | Status: DC

## 2015-05-12 NOTE — Progress Notes (Signed)
NEUROLOGY CLINIC FOLLOW UP PATIENT NOTE  NAME: Daniel Cain DOB: 1941-04-09  HPI: Daniel Cain is a 74 y.o. male with PMH of HTN and HLD who presents as a new patient for dizziness.   He had ER visit on 07/16/14 for episodic acute onset dizziness, foggy feeling in head, difficulty concentration and lightheadedness and N/V. Lasted about one hour and resolved. He had 4 similar episodes one month prior. No chest pain, palpitation, weakness or syncope. Had cardiology follow up with Dr. Jens Som in 08/2013, had nuclear stress study showed no ischemia.   During the summertime, he had no recurrent episodes. However on 02/09/2015, he had similar episode again, he got up to bathroom, had nausea vomiting, has to lie down, resolved in about 4 hours.  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  About 2 weeks ago on 04/15/2015, he was cooking in the back in the afternoon, similar episode started again, no nausea vomiting, mild spinning, he has to lie down and sleep for 12 hours. The second day he was back to his baseline.  Over this episode, he denies any headache, denies migraine history, no weakness, numbness, loss of consciousness, or hearing loss. He admits that recently he has some tinnitus in both ears, however mild, right more than left.  He went to see ENT Dr. 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 There is no evidence of central vision. Dix-Hallpike and roll tests were negative, showing no indication of BPPV. He was referred here for further dilation.  Interval history: During the interval time, pt had another episode of lightheadedness on 05/04/15. He was off two meetings at  11:30am, started to have lightheadedness, little vertigo lasting briefly, with one spell of N/V, went home and went to bed, episodes lasted about 3-4 hours but lethargy lasted the rest of the day. He woke up in the morning feeling fine. 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 MRA head and neck negative. EEG done this am.   Wife stated that she can recall that for the last one year, they may started to use a new brand of detergent for laundry, not sure if this is associated with episodes if it is migraine equivalent. Also wife reported that he drinks wine a lot than he should.    Past Medical History  Diagnosis Date  . Hypertension   . Hypercholesteremia   . Vertigo   . Hyperlipidemia   . Left bundle branch block   . Osteoarthritis   . Nocturia    Past Surgical History  Procedure Laterality Date  . Total hip arthroplasty Bilateral   . Hernia repair     Family History  Problem Relation Age of Onset  . COPD Mother   . Stroke Mother   . Hypertension Father    Current Outpatient Prescriptions  Medication Sig Dispense Refill  . aspirin 81 MG tablet Take 81 mg by mouth daily.    Marland Kitchen atorvastatin (LIPITOR) 80 MG tablet Take 1 tablet by mouth daily.    . carvedilol (COREG) 25 MG tablet Take 1 tablet by mouth 2 (two) times daily.    . clobetasol cream (TEMOVATE) 0.05 % Apply 1 application  topically 2 (two) times daily.    Marland Kitchen lisinopril (PRINIVIL,ZESTRIL) 20 MG tablet Take 1 tablet by mouth daily.    . Oxcarbazepine (TRILEPTAL) 300 MG tablet Take 1 tablet (300 mg total) by mouth 2 (two) times daily. 60 tablet 2   No current facility-administered medications for this visit.   No Known Allergies Social History   Social History  . Marital Status: Married    Spouse Name: N/A  . Number of Children: 3  . Years of Education: N/A   Occupational History  . Not on file.   Social History Main Topics  . Smoking status: Former Games developer  . Smokeless tobacco:  Not on file  . Alcohol Use: 0.6 oz/week    0 Standard drinks or equivalent, 1 Glasses of wine per week     Comment: 1-2 glasses wine per day  . Drug Use: No  . Sexual Activity: No   Other Topics Concern  . Not on file   Social History Narrative    Review of Systems Full 14 system review of systems performed and notable only for those listed, all others are neg:  Constitutional:   Cardiovascular:  Ear/Nose/Throat:   Skin:  Eyes:   Respiratory:   Gastroitestinal:   Genitourinary:  Hematology/Lymphatic:   Endocrine:  Musculoskeletal:   Allergy/Immunology:   Neurological:  Psychiatric:  Sleep:    Physical Exam  Filed Vitals:   05/12/15 1003  BP: 135/87  Pulse: 55    General - Well nourished, well developed, in no apparent distress.  Ophthalmologic - Sharp disc margins OU.  Cardiovascular - Regular rate and rhythm with no murmur.    Neck - supple, no nuchal rigidity .  Mental Status -  Level of arousal and orientation to time, place, and person were intact. Language including expression, naming, repetition, comprehension, reading, and writing was assessed and found intact. Attention span and concentration were normal. Recent and remote memory were intact. Fund of Knowledge was assessed and was intact.  Cranial Nerves II - XII - II - Visual field intact OU. III, IV, VI - Extraocular movements intact. V - Facial sensation intact bilaterally. VII - Facial movement intact bilaterally. VIII - Hearing & vestibular intact bilaterally. Weber's test in the middle, rinnie's test normal on the left side, decreased air conduction on the right. X - Palate elevates symmetrically. XI - Chin turning & shoulder shrug intact bilaterally. XII - Tongue protrusion intact.  Motor Strength - The patient's strength was normal in all extremities and pronator drift was absent.  Bulk was normal and fasciculations were absent.   Motor Tone - Muscle tone was assessed at the neck and  appendages and was normal.  Reflexes - The patient's reflexes were normal in all extremities and he had no pathological reflexes.  Sensory - Light touch, temperature/pinprick, vibration and proprioception, and Romberg testing were assessed and were normal.    Coordination - The patient had normal movements in the hands and feet with no ataxia or dysmetria.  Tremor was absent.  Gait and Station - The patient's transfers, posture, gait, station, and turns were observed as normal.  Dix-Hallpike negative  Imaging and labs  Stress test 09/22/2014- Low risk stress nuclear study With a moderate fixed perfusion defect in the inferoseptal wall that is most consistent with LBBB septal wall motion, but cannot exclude distal LAD (or rPDA) infarction. Marland Kitchen  VNG - abnormal.  The results indicating a right caloric weakness, which is most Likely consistent with a right sided  peripheral vestibular lesion. There is no evidence of a central lesion. Dix-Hallpike and roll tests were negative, showing no indication of BPPV. No spontaneous or gaze nystagmus was seen. No significant positional nystagmus was evident. Tracking and OPK abilities were normal. Saccades were normal. Results of caloric irrigations are consistent with a strong caloric weakness in the right year. No significant failure of fixation suppression was not evident on caloric tracings.  MRI brain 1. Minimal periventricular and subcortical chronic small vessel ischemic disease. 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 2. 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 No other lesions are seen on post contrast views.  3. Chronic pan-sinusitis.  4. No acute findings.  MRA head and neck - negative.  EEG - pending    Assessment and Plan:   Chipper Omanhomas J Mcconnell is a 74 y.o. male with PMH of hypertension, hyperlipidemia presents multiple episodes of acute onset lightheadedness, imbalance, nausea  vomiting. Lasting from 30 mins to 12 hours. During the interval time, patient is at baseline. No associated headache, weakness, loss of consciousness. VNG indicates right-sided peripheral vestibular lesion. Exam also showed right-sided rinnie's test decreased air conduction. 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 MRA head and neck negative. EEG pending. Etiology unclear, not typical for vascular disease and seizure, no acoustic neuroma. DDx include vestibular proxysmia, basilar type of migraine without headache, or telangectasia related. Will try trileptal low dose first.  - continue ASA and lipitor for stroke prevention  - will try trileptal low dose first. - follow up with ENT and PCP as scheduled - avoid fall during the episode - follow up in 2 months to check CBC and BMP.  No orders of the defined types were placed in this encounter.    Meds ordered this encounter  Medications  . Oxcarbazepine (TRILEPTAL) 300 MG tablet    Sig: Take 1 tablet (300 mg total) by mouth 2 (two) times daily.    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

## 2015-05-12 NOTE — Procedures (Signed)
    History:  Daniel Cain is a 74 year old gentleman with a history of an episode of dizziness that occurred on 05/04/2015. The patient had nausea and vomiting with the episode, with lethargy following the event. The patient is being evaluated for this episode.  This is a routine EEG. No skull defects are noted. Medications include aspirin, Lipitor, Coreg, lisinopril, and Trileptal.   EEG classification: Normal awake  Description of the recording: The background rhythms of this recording consists of a fairly well modulated medium amplitude alpha rhythm of 8 Hz that is reactive to eye opening and closure. As the record progresses, the patient appears to remain in the waking state throughout the recording. Photic stimulation and hyperventilation were not performed. At no time during the recording does there appear to be evidence of spike or spike wave discharges or evidence of focal slowing. EKG monitor shows no evidence of cardiac rhythm abnormalities with a heart rate of 56.  Impression: This is a normal EEG recording in the waking state. No evidence of ictal or interictal discharges are seen.

## 2015-05-12 NOTE — Patient Instructions (Signed)
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

## 2015-05-15 ENCOUNTER — Telehealth: Payer: Self-pay

## 2015-05-15 NOTE — Telephone Encounter (Signed)
Patient's wife is calling back for EEG results.  Thanks!

## 2015-05-15 NOTE — Telephone Encounter (Signed)
Lft vm for patient to call back about EEG results. 

## 2015-05-15 NOTE — Telephone Encounter (Signed)
-----   Message from Marvel PlanJindong Xu, MD sent at 05/12/2015  4:21 PM EST ----- Could you please let the patient know that his EEG done in our office today was negative. Please continue the current treatment plan. Thanks.  Marvel PlanJindong Xu, MD PhD Stroke Neurology 05/12/2015 4:21 PM

## 2015-05-15 NOTE — Telephone Encounter (Signed)
Rn call patients wife back Daniel Cain to notify her that the EEG was normal. Pts wife is on her husbands DPR form. PTs wife verbalized understanding.Rn also gave the message about her husband going to the ED if he has another episode of dizziness. See Dr. Roda ShuttersXu note below. Rn verbalized Dr. Roda ShuttersXu note to patients wife about what to do if he has another episodes. Pts wife verbalzied understanding. Daniel PlanJindong Xu, MD  Hildred AlaminKatrina Y Murrell, RN            Please tell the pt and wife the reason last time I asked them to go to ER if episodes recur is because he could get faster work up in ER than as outpt. But now, since his work up is almost complete, for his similar episodes, he does not need to go to ER. However, if the episodes are different from what he had before, or if there is anything alarming during the episodes or anything pt or wife does not feel comfortable to deal with or anything they think need urgent medical attention, please call 911 or EMS and go to ER for evaluation. Thanks.   Daniel PlanJindong Xu, MD PhD  Stroke Neurology  05/12/2015  1:51 PM

## 2015-05-23 ENCOUNTER — Telehealth: Payer: Self-pay | Admitting: Neurology

## 2015-05-23 NOTE — Telephone Encounter (Signed)
Called pt and his wife on the numbers listed in chart to answer their questions about balance issue. However, nobody available for the call. Left VM for them to call back and leave best number and time for me to call back.   Marvel PlanJindong Tarek Cravens, MD PhD Stroke Neurology 05/23/2015 6:08 PM

## 2015-05-24 NOTE — Telephone Encounter (Signed)
Finally able to talk to pt's wife. She has two questions, one is "whether pt should follow up with ENT and how soon that should be done" and I told her that I think 3 months follow up with ENT sounds very reasonable. She will make an appointment with them in mid of Feb 2017. The second questions is whether pt can go on flight with his condition and I said yes. Wife expressed understanding and appreciation.  Marvel PlanJindong Chavie Kolinski, MD PhD Stroke Neurology 05/24/2015 4:45 PM

## 2015-05-24 NOTE — Telephone Encounter (Signed)
Pts wife called back [phone]. Wife said she will be at this number for the rest of the day

## 2015-05-24 NOTE — Telephone Encounter (Signed)
Called pt wife back with the number listed at 4:02pm. She was not available and left message to call back again.   Marvel PlanJindong Jaculin Rasmus, MD PhD Stroke Neurology 05/24/2015 4:03 PM

## 2015-07-12 ENCOUNTER — Encounter (HOSPITAL_COMMUNITY): Payer: Self-pay | Admitting: *Deleted

## 2015-07-12 ENCOUNTER — Telehealth: Payer: Self-pay | Admitting: Neurology

## 2015-07-12 ENCOUNTER — Emergency Department (HOSPITAL_COMMUNITY)
Admission: EM | Admit: 2015-07-12 | Discharge: 2015-07-12 | Disposition: A | Discharge status: Home / Self Care | Payer: BC Managed Care – PPO | Attending: Emergency Medicine | Admitting: Emergency Medicine

## 2015-07-12 DIAGNOSIS — Z7982 Long term (current) use of aspirin: Secondary | ICD-10-CM | POA: Diagnosis not present

## 2015-07-12 DIAGNOSIS — E785 Hyperlipidemia, unspecified: Secondary | ICD-10-CM | POA: Insufficient documentation

## 2015-07-12 DIAGNOSIS — Z8679 Personal history of other diseases of the circulatory system: Secondary | ICD-10-CM | POA: Diagnosis not present

## 2015-07-12 DIAGNOSIS — M1990 Unspecified osteoarthritis, unspecified site: Secondary | ICD-10-CM | POA: Diagnosis not present

## 2015-07-12 DIAGNOSIS — R42 Dizziness and giddiness: Secondary | ICD-10-CM

## 2015-07-12 DIAGNOSIS — Z79899 Other long term (current) drug therapy: Secondary | ICD-10-CM | POA: Diagnosis not present

## 2015-07-12 DIAGNOSIS — Z87891 Personal history of nicotine dependence: Secondary | ICD-10-CM | POA: Diagnosis not present

## 2015-07-12 DIAGNOSIS — R112 Nausea with vomiting, unspecified: Secondary | ICD-10-CM | POA: Insufficient documentation

## 2015-07-12 DIAGNOSIS — E7800 Pure hypercholesterolemia, unspecified: Secondary | ICD-10-CM | POA: Insufficient documentation

## 2015-07-12 LAB — URINALYSIS, ROUTINE W REFLEX MICROSCOPIC
Bilirubin Urine: NEGATIVE
GLUCOSE, UA: NEGATIVE mg/dL
Hgb urine dipstick: NEGATIVE
Ketones, ur: NEGATIVE mg/dL
LEUKOCYTES UA: NEGATIVE
NITRITE: NEGATIVE
PH: 6 (ref 5.0–8.0)
Protein, ur: NEGATIVE mg/dL
Specific Gravity, Urine: 1.025 (ref 1.005–1.030)

## 2015-07-12 LAB — BASIC METABOLIC PANEL
Anion gap: 8 (ref 5–15)
BUN: 33 mg/dL — AB (ref 6–20)
CHLORIDE: 105 mmol/L (ref 101–111)
CO2: 25 mmol/L (ref 22–32)
Calcium: 8.7 mg/dL — ABNORMAL LOW (ref 8.9–10.3)
Creatinine, Ser: 1.13 mg/dL (ref 0.61–1.24)
GFR calc Af Amer: >60 mL/min (ref >60)
GFR calc non Af Amer: >60 mL/min (ref >60)
GLUCOSE: 151 mg/dL — AB (ref 65–99)
Potassium: 4.3 mmol/L (ref 3.5–5.1)
Sodium: 138 mmol/L (ref 135–145)

## 2015-07-12 LAB — CBC
HCT: 43.2 % (ref 39.0–52.0)
Hemoglobin: 14.3 g/dL (ref 13.0–17.0)
MCH: 31.4 pg (ref 26.0–34.0)
MCHC: 33.1 g/dL (ref 30.0–36.0)
MCV: 94.9 fL (ref 78.0–100.0)
Platelets: 224 10*3/uL (ref 150–400)
RBC: 4.55 MIL/uL (ref 4.22–5.81)
RDW: 12.7 % (ref 11.5–15.5)
WBC: 10.3 10*3/uL (ref 4.0–10.5)

## 2015-07-12 LAB — CBG MONITORING, ED: Glucose-Capillary: 144 mg/dL — ABNORMAL HIGH (ref 65–99)

## 2015-07-12 NOTE — Telephone Encounter (Signed)
Received call from [facility] ED and spoke with the provider there. She stated that pt now symptoms waning down but if move around may still has symptoms. I asked her to get a stat EEG for him. She kindly agreed. With see what EEG showed. If normal, I would like to increase pt trileptal to  po bid.  Marvel Plan, MD PhD Stroke Neurology 07/12/2015 2:07 PM

## 2015-07-12 NOTE — ED Provider Notes (Signed)
CSN: [id number]     Arrival date & time 07/12/15  8657 History   First MD Initiated Contact with Patient 07/12/15 1307     Chief Complaint  Patient presents with  . Dizziness  . Emesis     (Consider location/radiation/quality/duration/timing/severity/associated sxs/prior Treatment) HPI   Daniel Cain is a 75 y.o M who presents to the ED today c/o dizziness. Pt states that he has been having interrmitent episode of dizziness, light-headedness and feeling like the room is spinning. These episodes last anywhere from 2-8 hours. Today he had one when he woke up this morning and has persisted until now. Patient had associated nausea and vomiting, nonbloody nonbilious. Pt is follow by Dr. Roda Shutters with Neurology and Dr. Ezzard Standing with ENT for same. Pt and hs wife states that they were told by Dr. Roda Shutters at their last office visit in December 2016 that if pt had another episode to come to the ED for monitoring. Pts last episode before today was 05/04/2015.   Per patient record review, patient had normal outpatient EEG and MRA head. Patient had VNG performed which revealed peripheral vestibular lesion which might be the etiology of patient's symptoms. Gilberto Better was negative. Patient was placed on Trileptal which has been controlling his symptoms until today.  Past Medical History  Diagnosis Date  . Hypertension   . Hypercholesteremia   . Vertigo   . Hyperlipidemia   . Left bundle branch block   . Osteoarthritis   . Nocturia    Past Surgical History  Procedure Laterality Date  . Total hip arthroplasty Bilateral   . Hernia repair     Family History  Problem Relation Age of Onset  . COPD Mother   . Stroke Mother   . Hypertension Father    Social History  Substance Use Topics  . Smoking status: Former Games developer  . Smokeless tobacco: None  . Alcohol Use: 0.6 oz/week    0 Standard drinks or equivalent, 1 Glasses of wine per week     Comment: 1-2 glasses wine per day    Review of Systems  All  other systems reviewed and are negative.     Allergies  Review of patient's allergies indicates no known allergies.  Home Medications   Prior to Admission medications   Medication Sig Start Date End Date Taking? Authorizing Provider  aspirin 325 MG tablet Take 325-650 mg by mouth every 4 (four) hours as needed for mild pain, moderate pain or headache.   Yes Historical Provider, MD  aspirin EC 81 MG tablet Take 81 mg by mouth daily.   Yes Historical Provider, MD  atorvastatin (LIPITOR) 80 MG tablet Take 1 tablet by mouth daily. 08/22/14  Yes Historical Provider, MD  carvedilol (COREG) 25 MG tablet Take 1 tablet by mouth 2 (two) times daily. 08/03/14  Yes Historical Provider, MD  lisinopril-hydrochlorothiazide (PRINZIDE,ZESTORETIC) 20-12.5 MG tablet Take 1 tablet by mouth daily.   Yes Historical Provider, MD  Oxcarbazepine (TRILEPTAL) 300 MG tablet Take 1 tablet (300 mg total) by mouth 2 (two) times daily. 05/12/15  Yes Marvel Plan, MD   BP 149/83 mmHg  Pulse 56  Temp(Src) 97.4 F (36.3 C) (Oral)  Resp 15  SpO2 99% Physical Exam  Constitutional: He is oriented to person, place, and time. He appears well-developed and well-nourished. No distress.  HENT:  Head: Normocephalic and atraumatic.  Mouth/Throat: No oropharyngeal exudate.  Eyes: Conjunctivae and EOM are normal. Pupils are equal, round, and reactive to light. Right eye exhibits no  discharge. Left eye exhibits no discharge. No scleral icterus.  Cardiovascular: Normal rate, regular rhythm, normal heart sounds and intact distal pulses.  Exam reveals no gallop and no friction rub.   No murmur heard. Pulmonary/Chest: Effort normal and breath sounds normal. No respiratory distress. He has no wheezes. He has no rales. He exhibits no tenderness.  Abdominal: Soft. He exhibits no distension. There is no tenderness. There is no guarding.  Musculoskeletal: Normal range of motion. He exhibits no edema.  Neurological: He is alert and oriented  to person, place, and time. He has normal reflexes. No cranial nerve deficit. He exhibits normal muscle tone. Coordination normal.  Strength 5/5 throughout. No sensory deficits.    Skin: Skin is warm and dry. No rash noted. He is not diaphoretic. No erythema. No pallor.  Psychiatric: He has a normal mood and affect. His behavior is normal.  Nursing note and vitals reviewed.   ED Course  Procedures (including critical care time) Labs Review Labs Reviewed  BASIC METABOLIC PANEL - Abnormal; Notable for the following:    Glucose, Bld 151 (*)    BUN 33 (*)    Calcium 8.7 (*)    All other components within normal limits  CBG MONITORING, ED - Abnormal; Notable for the following:    Glucose-Capillary 144 (*)    All other components within normal limits  CBC  URINALYSIS, ROUTINE W REFLEX MICROSCOPIC (NOT AT [facility])    Imaging Review No results found. I have personally reviewed and evaluated these images and lab results as part of my medical decision-making.   EKG Interpretation None      MDM   Final diagnoses:  Dizziness    75 y.o M presents for episode of dizziness. Pt has been having recurrent episodes of dizziness, light-headed ness and feeling like the room is spinning intermittently for the last year and a half. He is followed by Dr. Roda Shutters with neurology for the symptoms. He has had a largely normal outpatient workup. Patient had a VNG performed which revealed peripheral vestibular lesion. Per patient and his wife he was encouraged to come to the emergency department if he experienced one of these episodes by Dr.XU for monitoring. Blood work in ED is within normal limits. Patient appears well. Symptoms are mild at this time. We will consult with Dr.XU for recommendations.  Spoke with Dr.Xu who recommends stat EEG. When results are back we'll reconsult.  Prior to EEG pt states that his symptoms have completely resolved and he does not wish to have EEG anymore. Spoke with Dr. Roda Shutters who  is ok with discharge. He requests increase in Trileptal to 600 mg BID. Pt will follow up with Dr. Roda Shutters in clinic. Return precautions outlined in patient discharge instructions.    Patient was discussed with and seen by Dr. Fayrene Fearing who agrees with the treatment plan.      Lester Kinsman [place], PA-C 07/12/15 1548  Rolland Porter, MD 07/20/15 [phone]

## 2015-07-12 NOTE — ED Notes (Signed)
EEG tech at bedside. 

## 2015-07-12 NOTE — Discharge Instructions (Signed)
Dizziness Dizziness is a common problem. It is a feeling of unsteadiness or light-headedness. You may feel like you are about to faint. Dizziness can lead to injury if you stumble or fall. Anyone can become dizzy, but dizziness is more common in older adults. This condition can be caused by a number of things, including medicines, dehydration, or illness. HOME CARE INSTRUCTIONS Taking these steps may help with your condition: Eating and Drinking  Drink enough fluid to keep your urine clear or pale yellow. This helps to keep you from becoming dehydrated. Try to drink more clear fluids, such as water.  Do not drink alcohol.  Limit your caffeine intake if directed by your health care provider.  Limit your salt intake if directed by your health care provider. Activity  Avoid making quick movements.  Rise slowly from chairs and steady yourself until you feel okay.  In the morning, first sit up on the side of the bed. When you feel okay, stand slowly while you hold onto something until you know that your balance is fine.  Move your legs often if you need to stand in one place for a long time. Tighten and relax your muscles in your legs while you are standing.  Do not drive or operate heavy machinery if you feel dizzy.  Avoid bending down if you feel dizzy. Place items in your home so that they are easy for you to reach without leaning over. Lifestyle  Do not use any tobacco products, including cigarettes, chewing tobacco, or electronic cigarettes. If you need help quitting, ask your health care provider.  Try to reduce your stress level, such as with yoga or meditation. Talk with your health care provider if you need help. General Instructions  Watch your dizziness for any changes.  Take medicines only as directed by your health care provider. Talk with your health care provider if you think that your dizziness is caused by a medicine that you are taking.  Tell a friend or a family  member that you are feeling dizzy. If he or she notices any changes in your behavior, have this person call your health care provider.  Keep all follow-up visits as directed by your health care provider. This is important. SEEK MEDICAL CARE IF:  Your dizziness does not go away.  Your dizziness or light-headedness gets worse.  You feel nauseous.  You have reduced hearing.  You have new symptoms.  You are unsteady on your feet or you feel like the room is spinning. SEEK IMMEDIATE MEDICAL CARE IF:  You vomit or have diarrhea and are unable to eat or drink anything.  You have problems talking, walking, swallowing, or using your arms, hands, or legs.  You feel generally weak.  You are not thinking clearly or you have trouble forming sentences. It may take a friend or family member to notice this.  You have chest pain, abdominal pain, shortness of breath, or sweating.  Your vision changes.  You notice any bleeding.  You have a headache.  You have neck pain or a stiff neck.  You have a fever.   This information is not intended to replace advice given to you by your health care provider. Make sure you discuss any questions you have with your health care provider.   Follow up with Dr. Roda Shutters if your symptoms persist. Continue home medications. Return to the ED if you experience severe worsening of your symptoms, vomiting, blurry vision, loss of consciousness.

## 2015-07-12 NOTE — Progress Notes (Signed)
Pt stated he did not need EEG, as symptoms have resolved.  Dr. Roda Shutters informed.

## 2015-07-12 NOTE — ED Notes (Signed)
Per pt's wife at bedside, Dr. Roda Shutters requesting "brain wave monitoring" while pt is currently having an episode of the dizziness.

## 2015-07-12 NOTE — ED Notes (Signed)
Per EMS - pt from home w/ c/o dizziness and n/v - pt w/ hx of same and was referred to ED by Dr. Roda Shutters, neuro for monitoring - pt has had MRIs in the past for same complaint. Pt given  zofran en route by EMS.

## 2015-07-12 NOTE — Telephone Encounter (Signed)
Pt's wife called sts he is at [facility] in ED. She said when pt saw Dr Roda Shutters last on 05/12/15 he implied that pt should have EEG when he is having "an episode". She inquired if Dr Roda Shutters could call ED and put in order for EEG. I explained to her that she could convey that message to RN in ED and that his OV could be seen by ED and determined what care the pt would need. She is aware Dr Roda Shutters at [facility].

## 2015-07-13 NOTE — Telephone Encounter (Addendum)
Pt's wife called and is a little confused about Dr. Warren Danes instructions while at the hospital. Would like a call back to discuss and would like to know if pt needs to come in before scheduled appt of March 10th.  Also needs to get clarification of increase in dosage of Oxcarbazepine (TRILEPTAL) 300 MG tablet  Please call and advise [phone]

## 2015-07-13 NOTE — Telephone Encounter (Signed)
Rn call patients wife back. Pt wife Daniel Cain stated her husband never got a EEG done because it was 6 hours and the symptoms have resolve. Rn stated that Dr. Roda Shutters wants her husband to take  trileptal bid. Rn stated that patient does not need to be seen sooner than March 10, per Dr.Xu request. Rn explain if patient has another episode he would have to go to [facility] and request a stat EEG. Rn stated she can call the office and working md or Dr Roda Shutters can call [facility] ED and put the order in. Pts wife verbalized understanding of the medication changes and the EEG.

## 2015-08-02 ENCOUNTER — Telehealth: Payer: Self-pay

## 2015-08-02 NOTE — Telephone Encounter (Signed)
Rn call [facility] to give verbal change in medication order. Dr Marvel PlanJindong Xu stated patient needs to take 2 tablets twice daily of Trilieptal. Pt was in the ED last month. There is a telephone note dated 07/12/2015 discussing increasing dosage. Pt was given 2 refills, and he has an appt in two weeks.[facility] will make the change.

## 2015-08-03 ENCOUNTER — Telehealth: Payer: Self-pay | Admitting: Neurology

## 2015-08-03 NOTE — Telephone Encounter (Signed)
Pt's wife called back requesting to be called back on her cell [phone]

## 2015-08-03 NOTE — Telephone Encounter (Signed)
Pt's wife called inquiring if pt should be seen at ENT before seeing Dr Roda ShuttersXu. York SpanielSaid he had appt on  07/12/15 but was at ED and they have never gotten around to r/s that appt. I advised if this appt was r/s it could result in a no show and possible $35 fee. Please call to advise.

## 2015-08-03 NOTE — Telephone Encounter (Signed)
IF patients wife call back, Dr. Roda ShuttersXu states patient needs to come in tomorrow at 0800 for appt.Any concerns will be address tomorrow at the office visit.  PT needs to check in at 0745am. LFt vm for patients wife Olegario MessierKathy. Dr. Roda ShuttersXu does know that he has not seen ENT because he was in the hospital in February 2017.

## 2015-08-04 ENCOUNTER — Ambulatory Visit (INDEPENDENT_AMBULATORY_CARE_PROVIDER_SITE_OTHER): Payer: BC Managed Care – PPO | Admitting: Neurology

## 2015-08-04 ENCOUNTER — Encounter: Payer: Self-pay | Admitting: Neurology

## 2015-08-04 VITALS — BP 117/75 | HR 52 | Ht 72.0 in | Wt 204.8 lb

## 2015-08-04 DIAGNOSIS — R42 Dizziness and giddiness: Secondary | ICD-10-CM | POA: Diagnosis not present

## 2015-08-04 DIAGNOSIS — I10 Essential (primary) hypertension: Secondary | ICD-10-CM

## 2015-08-04 DIAGNOSIS — E785 Hyperlipidemia, unspecified: Secondary | ICD-10-CM | POA: Diagnosis not present

## 2015-08-04 DIAGNOSIS — I781 Nevus, non-neoplastic: Secondary | ICD-10-CM

## 2015-08-04 NOTE — Patient Instructions (Addendum)
-   continue ASA and lipitor for stroke prevention  - continue trileptal 600mg  twice a day. 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 - avoid fall during the episode - follow up in 3 months and check BMP and CBC.

## 2015-08-04 NOTE — Progress Notes (Signed)
NEUROLOGY CLINIC FOLLOW UP PATIENT NOTE  NAME: ALOYSIUS HEINLE DOB: [birth year]  HPI: TAISEI BONNETTE is a 76 y.o. male with PMH of HTN and HLD who presents as a new patient for dizziness.   He had ER visit on 07/16/14 for episodic acute onset dizziness, foggy feeling in head, difficulty concentration and lightheadedness and N/V. Lasted about one hour and resolved. He had 4 similar episodes one month prior. No chest pain, palpitation, weakness or syncope. Had cardiology follow up with Dr. Stanford Breed in 08/2013, had nuclear stress study showed no ischemia.   During the summertime, he had no recurrent episodes. However on 02/09/2015, he had similar episode again, he got up to bathroom, had nausea vomiting, has to lie down, resolved in about 4 hours.  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  About 2 weeks ago on 04/15/2015, he was cooking in the back in the afternoon, similar episode started again, no nausea vomiting, mild spinning, he has to lie down and sleep for 12 hours. The second day he was back to his baseline.  Over this episode, he denies any headache, denies migraine history, no weakness, numbness, loss of consciousness, or hearing loss. He admits that recently he has some tinnitus in both ears, however mild, right more than left.  He went to see ENT Dr. 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 There is no evidence of central vision. Dix-Hallpike and roll tests were negative, showing no indication of BPPV. He was referred here for further dilation.  05/12/15 follow up - pt had another episode of lightheadedness on 05/04/15. He was off two meetings at 11:30am, started to have  lightheadedness, little vertigo lasting briefly, with one spell of N/V, went home and went to bed, episodes lasted about 3-4 hours but lethargy lasted the rest of the day. He woke up in the morning feeling fine. 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 MRA head and neck negative. EEG done this am.  Wife stated that she can recall that for the last one year, they may started to use a new brand of detergent for laundry, not sure if this is associated with episodes if it is migraine equivalent. Also wife reported that he drinks wine a lot than he should.    Interval history: During the interval time, pt had another episode on 07/12/15. He went to ER with hope to get a stat EEG to capture the episode. However, EEG was not done as pt symptoms nearly resolved and pt prefers no EEG at that time. His trileptal was increased to 649m bid. EEG done 05/12/15 in clinic showed normal EEG. Since the ER visit, pt has no more spells. Has not followed up with ENT yet. BP 117/75.   Past Medical History  Diagnosis Date  . Hypertension   . Hypercholesteremia   . Vertigo   . Hyperlipidemia   . Left bundle branch block   . Osteoarthritis   . Nocturia    Past Surgical History  Procedure Laterality Date  . Total hip arthroplasty Bilateral   . Hernia repair     Family History  Problem Relation Age of Onset  . COPD Mother   .  Stroke Mother   . Hypertension Father    Current Outpatient Prescriptions  Medication Sig Dispense Refill  . aspirin EC 81 MG tablet Take 81 mg by mouth daily.    Marland Kitchen atorvastatin (LIPITOR) 80 MG tablet Take 1 tablet by mouth daily.    . carvedilol (COREG) 25 MG tablet Take 1 tablet by mouth 2 (two) times daily.    Marland Kitchen lisinopril-hydrochlorothiazide (PRINZIDE,ZESTORETIC) 20-12.5 MG tablet Take 1 tablet by mouth daily.    . Oxcarbazepine (TRILEPTAL) 300 MG tablet Take 1 tablet (300 mg total) by mouth 2 (two) times daily. 60 tablet 2   No current  facility-administered medications for this visit.   No Known Allergies Social History   Social History  . Marital Status: Married    Spouse Name: N/A  . Number of Children: 3  . Years of Education: N/A   Occupational History  . Not on file.   Social History Main Topics  . Smoking status: Former Research scientist (life sciences)  . Smokeless tobacco: Not on file  . Alcohol Use: 0.6 oz/week    0 Standard drinks or equivalent, 1 Glasses of wine per week     Comment: 1-2 glasses wine per day  . Drug Use: No  . Sexual Activity: No   Other Topics Concern  . Not on file   Social History Narrative    Review of Systems Full 14 system review of systems performed and notable only for those listed, all others are neg:  Constitutional:   Cardiovascular:  Ear/Nose/Throat:  Ringing in ears Skin:  Eyes:   Respiratory:  cough Gastroitestinal:   Genitourinary: frequent urination Hematology/Lymphatic:   Endocrine:  Musculoskeletal:  Neck pain Allergy/Immunology:   Neurological: dizziness Psychiatric:  Sleep: snoring   Physical Exam  Filed Vitals:   08/04/15 0803  BP: 117/75  Pulse: 46    General - Well nourished, well developed, in no apparent distress.  Ophthalmologic - Sharp disc margins OU.  Cardiovascular - Regular rate and rhythm with no murmur.    Neck - supple, no nuchal rigidity .  Mental Status -  Level of arousal and orientation to time, place, and person were intact. Language including expression, naming, repetition, comprehension, reading, and writing was assessed and found intact. Attention span and concentration were normal. Recent and remote memory were intact. Fund of Knowledge was assessed and was intact.  Cranial Nerves II - XII - II - Visual field intact OU. III, IV, VI - Extraocular movements intact. V - Facial sensation intact bilaterally. VII - Facial movement intact bilaterally. VIII - Hearing & vestibular intact bilaterally. Weber's test in the middle, rinnie's  test normal on the left side, decreased air conduction on the right. X - Palate elevates symmetrically. XI - Chin turning & shoulder shrug intact bilaterally. XII - Tongue protrusion intact.  Motor Strength - The patient's strength was normal in all extremities and pronator drift was absent.  Bulk was normal and fasciculations were absent.   Motor Tone - Muscle tone was assessed at the neck and appendages and was normal.  Reflexes - The patient's reflexes were normal in all extremities and he had no pathological reflexes.  Sensory - Light touch, temperature/pinprick, vibration and proprioception, and Romberg testing were assessed and were normal.    Coordination - The patient had normal movements in the hands and feet with no ataxia or dysmetria.  Tremor was absent.  Gait and Station - The patient's transfers, posture, gait, station, and turns were observed as normal.  Dix-Hallpike  negative   Imaging and labs  Stress test 09/22/2014- Low risk stress nuclear study With a moderate fixed perfusion defect in the inferoseptal wall that is most consistent with LBBB septal wall motion, but cannot exclude distal LAD (or rPDA) infarction. Marland Kitchen  VNG - abnormal.  The results indicating a right caloric weakness, which is most Likely consistent with a right sided peripheral vestibular lesion. There is no evidence of a central lesion. Dix-Hallpike and roll tests were negative, showing no indication of BPPV. No spontaneous or gaze nystagmus was seen. No significant positional nystagmus was evident. Tracking and OPK abilities were normal. Saccades were normal. Results of caloric irrigations are consistent with a strong caloric weakness in the right year. No significant failure of fixation suppression was not evident on caloric tracings.  MRI brain 1. Minimal periventricular and subcortical chronic small vessel ischemic disease. 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 2. 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 No other lesions are seen on post contrast views.  3. Chronic pan-sinusitis.  4. No acute findings.  MRA head and neck - negative.  EEG 05/12/15 - normal EEG  Component     Latest Ref Rng 07/16/2014 07/12/2015  WBC     4.0 - 10.5 K/uL 7.0 10.3  RBC     4.22 - 5.81 MIL/uL 4.52 4.55  Hemoglobin     13.0 - 17.0 g/dL 14.3 14.3  HCT     39.0 - 52.0 % 42.7 43.2  MCV     78.0 - 100.0 fL 94.5 94.9  MCH     26.0 - 34.0 pg 31.6 31.4  MCHC     30.0 - 36.0 g/dL 33.5 33.1  RDW     11.5 - 15.5 % 12.2 12.7  Platelets     150 - 400 K/uL 197 224  Neutrophils     43 - 77 % 75   NEUT#     1.7 - 7.7 K/uL 5.3   Lymphocytes     12 - 46 % 13   Lymphocyte #     0.7 - 4.0 K/uL 0.9   Monocytes Relative     3 - 12 % 8   Monocyte #     0.1 - 1.0 K/uL 0.5   Eosinophil     0 - 5 % 4   Eosinophils Absolute     0.0 - 0.7 K/uL 0.3   Basophil     0 - 1 % 0   Basophils Absolute     0.0 - 0.1 K/uL 0.0   Sodium     135 - 145 mmol/L 138 138  Potassium     3.5 - 5.1 mmol/L 4.3 4.3  Chloride     101 - 111 mmol/L 104 105  CO2     22 - 32 mmol/L 27 25  Glucose     65 - 99 mg/dL 123 (H) 151 (H)  BUN     6 - 20 mg/dL 19 33 (H)  Creatinine     0.61 - 1.24 mg/dL 1.02 1.13  Calcium     8.9 - 10.3 mg/dL 9.2 8.7 (L)  Total Protein     6.0 - 8.3 g/dL 6.7   Albumin     3.5 - 5.2 g/dL 4.4   AST     0 - 37 U/L 25   ALT     0 - 53 U/L 24   Alkaline Phosphatase     39 -  117 U/L 57   Total Bilirubin     0.3 - 1.2 mg/dL 1.2   EGFR (Non-African Amer.)     >60 mL/min 71 (L) >60  EGFR (African American)     >60 mL/min 83 (L) >60  Anion gap     5 - _0 Assessment:   ALMIR BOTTS is a 75 y.o. male with PMH of hypertension, hyperlipidemia presents multiple episodes of acute onset lightheadedness, imbalance, nausea vomiting. Lasting from 30 mins to 12 hours. During the interval time, patient is at baseline. No associated  headache, weakness, loss of consciousness. VNG indicates right-sided peripheral vestibular lesion. Exam also showed right-sided rinnie's test decreased air conduction. 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 MRA head and neck negative. EEG normal. Etiology unclear, not typical for vascular disease and seizure, no acoustic neuroma. DDx include vestibular proxysmia, basilar type of migraine without headache, or telangectasia related. Started on trileptal low dose first, but still had one episode on 07/12/15, stat EEG not done. Increased trileptal to 619m bid  Plan: - continue ASA and lipitor for stroke prevention  - continue trileptal [id number] a day. 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 - avoid fall during the episode - follow up in 3 months and check BMP and CBC.  I spent more than 25 minutes of face to face time with the patient. Greater than 50% of time was spent in counseling and coordination of care.   No orders of the defined types were placed in this encounter.    No orders of the defined types were placed in this encounter.    Patient Instructions  - continue ASA and lipitor for stroke prevention  - continue trileptal [id number] a day. 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 - avoid fall during the episode - follow up in 3 months and check BMP and CBC.    JinRosalin HawkingD PhD [facility] [id number] [phone]

## 2015-10-30 ENCOUNTER — Telehealth: Payer: Self-pay | Admitting: Neurology

## 2015-10-30 ENCOUNTER — Other Ambulatory Visit: Payer: Self-pay

## 2015-10-30 MED ORDER — OXCARBAZEPINE 300 MG PO TABS: 300.0000 mg | ORAL_TABLET | Freq: Two times a day (BID) | ORAL | Status: DC

## 2015-10-30 NOTE — Telephone Encounter (Signed)
Daniel Cain with [facility] called requesting a refill on Oxcarbazepine (TRILEPTAL) 300 MG tablet.  [facility] [place] - [address]

## 2015-10-30 NOTE — Telephone Encounter (Signed)
Refill done for Trileptal for patient, sent via computer.

## 2015-11-07 ENCOUNTER — Ambulatory Visit (INDEPENDENT_AMBULATORY_CARE_PROVIDER_SITE_OTHER): Payer: BC Managed Care – PPO | Admitting: Neurology

## 2015-11-07 ENCOUNTER — Encounter: Payer: Self-pay | Admitting: Neurology

## 2015-11-07 VITALS — BP 108/72 | HR 53 | Ht 72.0 in | Wt 200.6 lb

## 2015-11-07 DIAGNOSIS — I781 Nevus, non-neoplastic: Secondary | ICD-10-CM

## 2015-11-07 DIAGNOSIS — R42 Dizziness and giddiness: Secondary | ICD-10-CM | POA: Diagnosis not present

## 2015-11-07 DIAGNOSIS — E785 Hyperlipidemia, unspecified: Secondary | ICD-10-CM

## 2015-11-07 DIAGNOSIS — I10 Essential (primary) hypertension: Secondary | ICD-10-CM | POA: Diagnosis not present

## 2015-11-07 MED ORDER — OXCARBAZEPINE 600 MG PO TABS: 600.0000 mg | ORAL_TABLET | Freq: Two times a day (BID) | ORAL | Status: DC

## 2015-11-07 NOTE — Progress Notes (Signed)
NEUROLOGY CLINIC FOLLOW UP PATIENT NOTE  NAME: Daniel Cain DOB: 1941/03/10  HPI: Daniel Cain is a 75 y.o. male with PMH of HTN and HLD who presents as a new patient for dizziness.   He had ER visit on 07/16/14 for episodic acute onset dizziness, foggy feeling in head, difficulty concentration and lightheadedness and N/V. Lasted about one hour and resolved. He had 4 similar episodes one month prior. No chest pain, palpitation, weakness or syncope. Had cardiology follow up with Dr. Stanford Breed in 08/2013, had nuclear stress study showed no ischemia.   During the summertime, he had no recurrent episodes. However on 02/09/2015, he had similar episode again, he got up to bathroom, had nausea vomiting, has to lie down, resolved in about 4 hours.  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  About 2 weeks ago on 04/15/2015, he was cooking in the back in the afternoon, similar episode started again, no nausea vomiting, mild spinning, he has to lie down and sleep for 12 hours. The second day he was back to his baseline.  Over this episode, he denies any headache, denies migraine history, no weakness, numbness, loss of consciousness, or hearing loss. He admits that recently he has some tinnitus in both ears, however mild, right more than left.  He went to see ENT Dr. 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 There is no evidence of central vision. Dix-Hallpike and roll tests were negative, showing no indication of BPPV. He was referred here for further dilation.  05/12/15 follow up - pt had another episode of lightheadedness on 05/04/15. He was off two meetings at 11:30am, started to have  lightheadedness, little vertigo lasting briefly, with one spell of N/V, went home and went to bed, episodes lasted about 3-4 hours but lethargy lasted the rest of the day. He woke up in the morning feeling fine. 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 MRA head and neck negative. EEG done this am.  Wife stated that she can recall that for the last one year, they may started to use a new brand of detergent for laundry, not sure if this is associated with episodes if it is migraine equivalent. Also wife reported that he drinks wine a lot than he should.    08/04/15 follow up - pt had another episode on 07/12/15. He went to ER with hope to get a stat EEG to capture the episode. However, EEG was not done as pt symptoms nearly resolved and pt prefers no EEG at that time. His trileptal was increased to 65m bid. EEG done 05/12/15 in clinic showed normal EEG. Since the ER visit, pt has no more spells. Has not followed up with ENT yet. BP 117/75.  Interval history: During the interval time, pt has been doing well. No recurrent episodes. He is taking 6045mbid of trileptal. He also followed up with ENT and considered questionable meniere's disease and advised on low salt diet and diuretics. He is on lisinopril, HCTZ and coreg for HTN for a long time. His BP today 108/72.    Past Medical History  Diagnosis Date  . Hypertension   . Hypercholesteremia   . Vertigo   .  Hyperlipidemia   . Left bundle branch block   . Osteoarthritis   . Nocturia    Past Surgical History  Procedure Laterality Date  . Total hip arthroplasty Bilateral   . Hernia repair     Family History  Problem Relation Age of Onset  . COPD Mother   . Stroke Mother   . Hypertension Father    Current Outpatient Prescriptions  Medication Sig Dispense Refill  . aspirin EC 81 MG tablet Take 81 mg by mouth daily.    Marland Kitchen atorvastatin (LIPITOR) 80 MG tablet Take 1 tablet by mouth daily.    . carvedilol  (COREG) 25 MG tablet Take 1 tablet by mouth 2 (two) times daily.    Marland Kitchen lisinopril-hydrochlorothiazide (PRINZIDE,ZESTORETIC) 20-12.5 MG tablet Take 1 tablet by mouth daily.    Marland Kitchen oxcarbazepine (TRILEPTAL) 600 MG tablet Take 1 tablet (600 mg total) by mouth 2 (two) times daily. 180 tablet 3   No current facility-administered medications for this visit.   No Known Allergies Social History   Social History  . Marital Status: Married    Spouse Name: N/A  . Number of Children: 3  . Years of Education: N/A   Occupational History  . Not on file.   Social History Main Topics  . Smoking status: Former Research scientist (life sciences)  . Smokeless tobacco: Not on file  . Alcohol Use: 0.6 oz/week    0 Standard drinks or equivalent, 1 Glasses of wine per week     Comment: 1-2 glasses wine per day  . Drug Use: No  . Sexual Activity: No   Other Topics Concern  . Not on file   Social History Narrative    Review of Systems Full 14 system review of systems performed and notable only for those listed, all others are neg:  Constitutional:   Cardiovascular:  Ear/Nose/Throat:  Ringing in ears Skin:  Eyes:   Respiratory:  cough Gastroitestinal:   Genitourinary: frequent urination Hematology/Lymphatic:   Endocrine:  Musculoskeletal:  Neck pain Allergy/Immunology:   Neurological: dizziness Psychiatric:  Sleep: snoring   Physical Exam  There were no vitals filed for this visit.  General - Well nourished, well developed, in no apparent distress.  Ophthalmologic - Sharp disc margins OU.  Cardiovascular - Regular rate and rhythm with no murmur.    Neck - supple, no nuchal rigidity .  Mental Status -  Level of arousal and orientation to time, place, and person were intact. Language including expression, naming, repetition, comprehension, reading, and writing was assessed and found intact. Attention span and concentration were normal. Recent and remote memory were intact. Fund of Knowledge was assessed and  was intact.  Cranial Nerves II - XII - II - Visual field intact OU. III, IV, VI - Extraocular movements intact. V - Facial sensation intact bilaterally. VII - Facial movement intact bilaterally. VIII - Hearing & vestibular intact bilaterally. Weber's test in the middle, rinnie's test normal on the left side, decreased air conduction on the right. X - Palate elevates symmetrically. XI - Chin turning & shoulder shrug intact bilaterally. XII - Tongue protrusion intact.  Motor Strength - The patient's strength was normal in all extremities and pronator drift was absent.  Bulk was normal and fasciculations were absent.   Motor Tone - Muscle tone was assessed at the neck and appendages and was normal.  Reflexes - The patient's reflexes were normal in all extremities and he had no pathological reflexes.  Sensory - Light touch, temperature/pinprick, vibration and proprioception, and  Romberg testing were assessed and were normal.    Coordination - The patient had normal movements in the hands and feet with no ataxia or dysmetria.  Tremor was absent.  Gait and Station - The patient's transfers, posture, gait, station, and turns were observed as normal.  Dix-Hallpike negative   Imaging and labs  Stress test 09/22/2014- Low risk stress nuclear study With a moderate fixed perfusion defect in the inferoseptal wall that is most consistent with LBBB septal wall motion, but cannot exclude distal LAD (or rPDA) infarction. Marland Kitchen  VNG - abnormal.  The results indicating a right caloric weakness, which is most Likely consistent with a right sided peripheral vestibular lesion. There is no evidence of a central lesion. Dix-Hallpike and roll tests were negative, showing no indication of BPPV. No spontaneous or gaze nystagmus was seen. No significant positional nystagmus was evident. Tracking and OPK abilities were normal. Saccades were normal. Results of caloric irrigations are consistent with a strong caloric  weakness in the right year. No significant failure of fixation suppression was not evident on caloric tracings.  MRI brain 1. Minimal periventricular and subcortical chronic small vessel ischemic disease. 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 2. 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 No other lesions are seen on post contrast views.  3. Chronic pan-sinusitis.  4. No acute findings.  MRA head and neck - negative.  EEG 05/12/15 - normal EEG  Component     Latest Ref Rng 07/16/2014 07/12/2015  WBC     4.0 - 10.5 K/uL 7.0 10.3  RBC     4.22 - 5.81 MIL/uL 4.52 4.55  Hemoglobin     13.0 - 17.0 g/dL 14.3 14.3  HCT     39.0 - 52.0 % 42.7 43.2  MCV     78.0 - 100.0 fL 94.5 94.9  MCH     26.0 - 34.0 pg 31.6 31.4  MCHC     30.0 - 36.0 g/dL 33.5 33.1  RDW     11.5 - 15.5 % 12.2 12.7  Platelets     150 - 400 K/uL 197 224  Neutrophils     43 - 77 % 75   NEUT#     1.7 - 7.7 K/uL 5.3   Lymphocytes     12 - 46 % 13   Lymphocyte #     0.7 - 4.0 K/uL 0.9   Monocytes Relative     3 - 12 % 8   Monocyte #     0.1 - 1.0 K/uL 0.5   Eosinophil     0 - 5 % 4   Eosinophils Absolute     0.0 - 0.7 K/uL 0.3   Basophil     0 - 1 % 0   Basophils Absolute     0.0 - 0.1 K/uL 0.0   Sodium     135 - 145 mmol/L 138 138  Potassium     3.5 - 5.1 mmol/L 4.3 4.3  Chloride     101 - 111 mmol/L 104 105  CO2     22 - 32 mmol/L 27 25  Glucose     65 - 99 mg/dL 123 (H) 151 (H)  BUN     6 - 20 mg/dL 19 33 (H)  Creatinine     0.61 - 1.24 mg/dL 1.02 1.13  Calcium     8.9 - 10.3 mg/dL 9.2 8.7 (L)  Total Protein     6.0 -  8.3 g/dL 6.7   Albumin     3.5 - 5.2 g/dL 4.4   AST     0 - 37 U/L 25   ALT     0 - 53 U/L 24   Alkaline Phosphatase     39 - 117 U/L 57   Total Bilirubin     0.3 - 1.2 mg/dL 1.2   EGFR (Non-African Amer.)     >60 mL/min 71 (L) >60  EGFR (African American)     >60 mL/min 83 (L) >60  Anion gap     5 - _0 Assessment:   Daniel Cain is a 75 y.o. male with PMH of hypertension, hyperlipidemia presents multiple episodes of acute onset lightheadedness, imbalance, nausea vomiting. Lasting from 30 mins to 12 hours. During the interval time, patient is at baseline. No associated headache, weakness, loss of consciousness. VNG indicates right-sided peripheral vestibular lesion. Exam also showed right-sided rinnie's test decreased air conduction. 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 MRA head and neck negative. EEG normal. Etiology unclear, not typical for vascular disease and seizure, no acoustic neuroma. DDx include vestibular proxysmia, basilar type of migraine without headache, or telangectasia related. Started on trileptal low dose first, but still had one episode on 07/12/15, stat EEG not done. Increased trileptal to 683m bid. Since then, no more recurrent episode. Followed up with ENT also, recommended low salt diet and diuretics for questionable meniere's. He is already on HCTZ for HTN.   Plan: - continue ASA and lipitor for stroke prevention  - continue trileptal 6021mtwice a day. - follow up with ENT as scheduled - avoid fall during the episode - check BP at home and record and bring over to Dr. LiRex Krasor medication adjustment if needed. - check BMP and CBC. - follow up in 6 months.    Orders Placed This Encounter  Procedures  . CBC  . Basic metabolic panel    Meds ordered this encounter  Medications  . oxcarbazepine (TRILEPTAL) 600 MG tablet    Sig: Take 1 tablet (600 mg total) by mouth 2 (two) times daily.    Dispense:  180 tablet    Refill:  3    Patient Instructions  - continue ASA and lipitor for stroke prevention  - continue trileptal 60018mwice a day. - follow up with ENT as scheduled - avoid fall during the episode - check BP at home and record and bring over to Dr. LitRex Krasr medication adjustment if needed. - check BMP and CBC. - follow up  in 6 months.     JinRosalin HawkingD PhD [facility] [id number] [phone]

## 2015-11-07 NOTE — Patient Instructions (Addendum)
-   continue ASA and lipitor for stroke prevention  - continue trileptal 600mg  twice a day. - follow up with ENT as scheduled - avoid fall during the episode - check BP at home and record and bring over to Dr. Clarene DukeLittle for medication adjustment if needed. - check BMP and CBC. - follow up in 6 months.

## 2015-11-08 ENCOUNTER — Telehealth: Payer: Self-pay

## 2015-11-08 LAB — CBC
Hematocrit: 40.3 % (ref 37.5–51.0)
Hemoglobin: 13.4 g/dL (ref 12.6–17.7)
MCH: 31.3 pg (ref 26.6–33.0)
MCHC: 33.3 g/dL (ref 31.5–35.7)
MCV: 94 fL (ref 79–97)
PLATELETS: 195 10*3/uL (ref 150–379)
RBC: 4.28 x10E6/uL (ref 4.14–5.80)
RDW: 13.4 % (ref 12.3–15.4)
WBC: 5.5 10*3/uL (ref 3.4–10.8)

## 2015-11-08 LAB — BASIC METABOLIC PANEL
BUN/Creatinine Ratio: 21 (ref 10–24)
BUN: 29 mg/dL — ABNORMAL HIGH (ref 8–27)
CO2: 23 mmol/L (ref 18–29)
Calcium: 9 mg/dL (ref 8.6–10.2)
Chloride: 97 mmol/L (ref 96–106)
Creatinine, Ser: 1.41 mg/dL — ABNORMAL HIGH (ref 0.76–1.27)
GFR calc Af Amer: 57 mL/min/{1.73_m2} — ABNORMAL LOW (ref >59)
GFR calc non Af Amer: 49 mL/min/{1.73_m2} — ABNORMAL LOW (ref >59)
GLUCOSE: 106 mg/dL — AB (ref 65–99)
POTASSIUM: 5.1 mmol/L (ref 3.5–5.2)
SODIUM: 136 mmol/L (ref 134–144)

## 2015-11-08 NOTE — Telephone Encounter (Signed)
-----   Message from Marvel PlanJindong Xu, MD sent at 11/08/2015  6:38 AM EDT ----- Could you please let the pt know that his blood test yesterday showed normal sodium and normal platelet count. However, it seemed that he was dehydrated yesterday from the tests. Please encourage him to drink more fluid and avoid dehydration. Thanks.   Marvel PlanJindong Xu, MD PhD Stroke Neurology 11/08/2015 6:38 AM

## 2015-11-08 NOTE — Telephone Encounter (Signed)
LFT vm for patient to call back about lab results.

## 2015-11-09 NOTE — Telephone Encounter (Signed)
Patient returned Katrina's call °

## 2015-11-09 NOTE — Telephone Encounter (Signed)
Rn talk to patient about his lab work. Rn stated that his blood test yesterday showed normal sodium and normal platelet count. However, it seemed that he was dehydrated yesterday from the tests. Please encourage him to drink more fluid and avoid dehydration.Pt verbalized understanding.of need to drink more fluids and stay hydrated.

## 2016-05-09 ENCOUNTER — Encounter: Payer: Self-pay | Admitting: Neurology

## 2016-05-09 ENCOUNTER — Ambulatory Visit (INDEPENDENT_AMBULATORY_CARE_PROVIDER_SITE_OTHER): Payer: BC Managed Care – PPO | Admitting: Neurology

## 2016-05-09 VITALS — BP 155/91 | HR 55 | Ht 72.0 in | Wt 202.8 lb

## 2016-05-09 DIAGNOSIS — I781 Nevus, non-neoplastic: Secondary | ICD-10-CM

## 2016-05-09 DIAGNOSIS — I10 Essential (primary) hypertension: Secondary | ICD-10-CM

## 2016-05-09 DIAGNOSIS — R42 Dizziness and giddiness: Secondary | ICD-10-CM

## 2016-05-09 DIAGNOSIS — E785 Hyperlipidemia, unspecified: Secondary | ICD-10-CM

## 2016-05-09 NOTE — Progress Notes (Signed)
NEUROLOGY CLINIC FOLLOW UP PATIENT NOTE  NAME: Daniel Cain DOB: 1940-06-20  HPI: Daniel Cain is a 75 y.o. male with PMH of HTN and HLD who presents as a new patient for dizziness.   He had ER visit on 07/16/14 for episodic acute onset dizziness, foggy feeling in head, difficulty concentration and lightheadedness and N/V. Lasted about one hour and resolved. He had 4 similar episodes one month prior. No chest pain, palpitation, weakness or syncope. Had cardiology follow up with Dr. Stanford Breed in 08/2013, had nuclear stress study showed no ischemia.   During the summertime, he had no recurrent episodes. However on 02/09/2015, he had similar episode again, he got up to bathroom, had nausea vomiting, has to lie down, resolved in about 4 hours.  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  About 2 weeks ago on 04/15/2015, he was cooking in the back in the afternoon, similar episode started again, no nausea vomiting, mild spinning, he has to lie down and sleep for 12 hours. The second day he was back to his baseline.  Over this episode, he denies any headache, denies migraine history, no weakness, numbness, loss of consciousness, or hearing loss. He admits that recently he has some tinnitus in both ears, however mild, right more than left.  He went to see ENT Dr. 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 There is no evidence of central vision. Dix-Hallpike and roll tests were negative, showing no indication of BPPV. He was referred here for further dilation.  05/12/15 follow up - pt had another episode of lightheadedness on 05/04/15. He was off two meetings at 11:30am, started to have  lightheadedness, little vertigo lasting briefly, with one spell of N/V, went home and went to bed, episodes lasted about 3-4 hours but lethargy lasted the rest of the day. He woke up in the morning feeling fine. 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 MRA head and neck negative. EEG done this am.  Wife stated that she can recall that for the last one year, they may started to use a new brand of detergent for laundry, not sure if this is associated with episodes if it is migraine equivalent. Also wife reported that he drinks wine a lot than he should.    08/04/15 follow up - pt had another episode on 07/12/15. He went to ER with hope to get a stat EEG to capture the episode. However, EEG was not done as pt symptoms nearly resolved and pt prefers no EEG at that time. His trileptal was increased to 619m bid. EEG done 05/12/15 in clinic showed normal EEG. Since the ER visit, pt has no more spells. Has not followed up with ENT yet. BP 117/75.  11/07/15 follow up - pt has been doing well. No recurrent episodes. He is taking 6033mbid of trileptal. He also followed up with ENT and considered questionable meniere's disease and advised on low salt diet and diuretics. He is on lisinopril, HCTZ and coreg for HTN for a long time. His BP today 108/72.   Interval history: During the interval time, he has been doing well. No recurrent episodes. He is still on trileptal 60063m  bid without side effects. Recent had physical with PCP was told normal. Will need to request recent labs. His BP today 155/91 and he said at home 120s/80s. He is following with PCP for BP management.    Past Medical History:  Diagnosis Date  . Hypercholesteremia   . Hyperlipidemia   . Hypertension   . Left bundle branch block   . Nocturia   . Osteoarthritis   . Vertigo    Past Surgical History:  Procedure Laterality Date  . HERNIA REPAIR    . TOTAL HIP ARTHROPLASTY Bilateral    Family History    Problem Relation Age of Onset  . COPD Mother   . Stroke Mother   . Hypertension Father    Current Outpatient Prescriptions  Medication Sig Dispense Refill  . aspirin EC 81 MG tablet Take 81 mg by mouth daily.    Marland Kitchen atorvastatin (LIPITOR) 80 MG tablet Take 1 tablet by mouth daily.    . carvedilol (COREG) 25 MG tablet Take 1 tablet by mouth 2 (two) times daily.    Marland Kitchen lisinopril-hydrochlorothiazide (PRINZIDE,ZESTORETIC) 20-12.5 MG tablet Take 1 tablet by mouth daily.    Marland Kitchen oxcarbazepine (TRILEPTAL) 600 MG tablet Take 1 tablet (600 mg total) by mouth 2 (two) times daily. 180 tablet 3   No current facility-administered medications for this visit.    No Known Allergies Social History   Social History  . Marital status: Married    Spouse name: N/A  . Number of children: 3  . Years of education: N/A   Occupational History  . Not on file.   Social History Main Topics  . Smoking status: Former Research scientist (life sciences)  . Smokeless tobacco: Never Used  . Alcohol use 0.6 oz/week    1 Glasses of wine per week     Comment: 1-2 glasses wine per day  . Drug use: No  . Sexual activity: No   Other Topics Concern  . Not on file   Social History Narrative  . No narrative on file    Review of Systems Full 14 system review of systems performed and notable only for those listed, all others are neg:  Constitutional:   Cardiovascular:  Ear/Nose/Throat:   Skin:  Eyes:   Respiratory:   Gastroitestinal:   Genitourinary: frequent urination Hematology/Lymphatic:   Endocrine:  Musculoskeletal:   Allergy/Immunology:   Neurological:  Psychiatric:  Sleep:    Physical Exam  Vitals:   05/09/16 0758  BP: (!) 155/91  Pulse: (!) 55    General - Well nourished, well developed, in no apparent distress.  Ophthalmologic - Sharp disc margins OU.  Cardiovascular - Regular rate and rhythm with no murmur.    Neck - supple, no nuchal rigidity .  Mental Status -  Level of arousal and orientation to time,  place, and person were intact. Language including expression, naming, repetition, comprehension, reading, and writing was assessed and found intact. Attention span and concentration were normal. Recent and remote memory were intact. Fund of Knowledge was assessed and was intact.  Cranial Nerves II - XII - II - Visual field intact OU. III, IV, VI - Extraocular movements intact. V - Facial sensation intact bilaterally. VII - Facial movement intact bilaterally. VIII - Hearing & vestibular intact bilaterally. X - Palate elevates symmetrically. XI - Chin turning & shoulder shrug intact bilaterally. XII - Tongue protrusion intact.  Motor Strength - The patient's strength was normal in all extremities and pronator drift was absent.  Bulk was normal and  fasciculations were absent.   Motor Tone - Muscle tone was assessed at the neck and appendages and was normal.  Reflexes - The patient's reflexes were normal in all extremities and he had no pathological reflexes.  Sensory - Light touch, temperature/pinprick, vibration and proprioception, and Romberg testing were assessed and were normal.    Coordination - The patient had normal movements in the hands and feet with no ataxia or dysmetria.  Tremor was absent.  Gait and Station - The patient's transfers, posture, gait, station, and turns were observed as normal.   Imaging and labs  Stress test 09/22/2014- Low risk stress nuclear study With a moderate fixed perfusion defect in the inferoseptal wall that is most consistent with LBBB septal wall motion, but cannot exclude distal LAD (or rPDA) infarction. Marland Kitchen  VNG - abnormal.  The results indicating a right caloric weakness, which is most Likely consistent with a right sided peripheral vestibular lesion. There is no evidence of a central lesion. Dix-Hallpike and roll tests were negative, showing no indication of BPPV. No spontaneous or gaze nystagmus was seen. No significant positional nystagmus was  evident. Tracking and OPK abilities were normal. Saccades were normal. Results of caloric irrigations are consistent with a strong caloric weakness in the right year. No significant failure of fixation suppression was not evident on caloric tracings.  MRI brain 1. Minimal periventricular and subcortical chronic small vessel ischemic disease. 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 2. 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 No other lesions are seen on post contrast views.  3. Chronic pan-sinusitis.  4. No acute findings.  MRA head and neck - negative.  EEG 05/12/15 - normal EEG  Component     Latest Ref Rng 07/16/2014 07/12/2015  WBC     4.0 - 10.5 K/uL 7.0 10.3  RBC     4.22 - 5.81 MIL/uL 4.52 4.55  Hemoglobin     13.0 - 17.0 g/dL 14.3 14.3  HCT     39.0 - 52.0 % 42.7 43.2  MCV     78.0 - 100.0 fL 94.5 94.9  MCH     26.0 - 34.0 pg 31.6 31.4  MCHC     30.0 - 36.0 g/dL 33.5 33.1  RDW     11.5 - 15.5 % 12.2 12.7  Platelets     150 - 400 K/uL 197 224  Neutrophils     43 - 77 % 75   NEUT#     1.7 - 7.7 K/uL 5.3   Lymphocytes     12 - 46 % 13   Lymphocyte #     0.7 - 4.0 K/uL 0.9   Monocytes Relative     3 - 12 % 8   Monocyte #     0.1 - 1.0 K/uL 0.5   Eosinophil     0 - 5 % 4   Eosinophils Absolute     0.0 - 0.7 K/uL 0.3   Basophil     0 - 1 % 0   Basophils Absolute     0.0 - 0.1 K/uL 0.0   Sodium     135 - 145 mmol/L 138 138  Potassium     3.5 - 5.1 mmol/L 4.3 4.3  Chloride     101 - 111 mmol/L 104 105  CO2     22 - 32 mmol/L 27 25  Glucose     65 - 99 mg/dL 123 (H) 151 (H)  BUN  6 - 20 mg/dL 19 33 (H)  Creatinine     0.61 - 1.24 mg/dL 1.02 1.13  Calcium     8.9 - 10.3 mg/dL 9.2 8.7 (L)  Total Protein     6.0 - 8.3 g/dL 6.7   Albumin     3.5 - 5.2 g/dL 4.4   AST     0 - 37 U/L 25   ALT     0 - 53 U/L 24   Alkaline Phosphatase     39 - 117 U/L 57   Total Bilirubin     0.3 - 1.2 mg/dL  1.2   EGFR (Non-African Amer.)     >60 mL/min 71 (L) >60  EGFR (African American)     >60 mL/min 83 (L) >60  Anion gap     5 - 15 7 8      Assessment:   Daniel Cain is a 75 y.o. male with PMH of hypertension, hyperlipidemia presents multiple episodes of acute onset lightheadedness, imbalance, nausea vomiting. Lasting from 30 mins to 12 hours. During the interval time, patient is at baseline. No associated headache, weakness, loss of consciousness. VNG indicates right-sided peripheral vestibular lesion. Exam also showed right-sided rinnie's test decreased air conduction. 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 MRA head and neck negative. EEG normal. Etiology unclear, not typical for vascular disease and seizure, no acoustic neuroma. DDx include vestibular proxysmia, basilar type of migraine without headache, or telangectasia related episodes. Started on trileptal low dose first, but still had one episode on 07/12/15, stat EEG not done. Increased trileptal to 645m bid. Since then, no more recurrent episode. Followed up with ENT also, recommended low salt diet and diuretics for questionable meniere's. He is already on HCTZ for HTN.   Plan: - continue ASA and lipitor for stroke prevention  - continue trileptal 6078mtwice a day. - request from PCP recent lab results - check BP at home and record - follow up in one year.    No orders of the defined types were placed in this encounter.   No orders of the defined types were placed in this encounter.   Patient Instructions  - continue ASA and lipitor for stroke prevention  - continue trileptal 60045mwice a day. - request from PCP recent lab results - check BP at home and record - follow up in one year.    JinRosalin HawkingD PhD [facility] [id number]WhittiereFeltonC [phone]

## 2016-05-09 NOTE — Patient Instructions (Signed)
-   continue ASA and lipitor for stroke prevention  - continue trileptal 600mg  twice a day. - request from PCP recent lab results - check BP at home and record - follow up in one year.

## 2016-12-05 ENCOUNTER — Telehealth: Payer: Self-pay | Admitting: Neurology

## 2016-12-05 ENCOUNTER — Other Ambulatory Visit: Payer: Self-pay

## 2016-12-05 DIAGNOSIS — R42 Dizziness and giddiness: Secondary | ICD-10-CM

## 2016-12-05 MED ORDER — OXCARBAZEPINE 600 MG PO TABS: 600.0000 mg | ORAL_TABLET | Freq: Two times a day (BID) | ORAL | 4 refills | Status: DC

## 2016-12-05 NOTE — Telephone Encounter (Signed)
Refill sent to [facility] 

## 2016-12-05 NOTE — Telephone Encounter (Signed)
[facility]  requesting refill of oxcarbazepine (TRILEPTAL) 600 MG tablet.

## 2017-05-07 ENCOUNTER — Ambulatory Visit: Payer: BC Managed Care – PPO | Admitting: Neurology

## 2018-01-13 ENCOUNTER — Telehealth: Payer: Self-pay

## 2018-01-13 NOTE — Telephone Encounter (Signed)
Pt last seen 04/2016, pt cancel appt 04/2017.Pt needs to schedule an appt with Dr. Pearlean BrownieSethi within a month, and be seen. Rn receive paper refill for trilpetal.

## 2018-01-22 ENCOUNTER — Ambulatory Visit: Payer: BC Managed Care – PPO | Admitting: Adult Health

## 2018-01-22 ENCOUNTER — Ambulatory Visit: Payer: BC Managed Care – PPO | Admitting: Neurology

## 2018-01-22 ENCOUNTER — Encounter: Payer: Self-pay | Admitting: Adult Health

## 2018-01-22 VITALS — BP 141/80 | HR 57 | Ht 70.0 in | Wt 201.0 lb

## 2018-01-22 DIAGNOSIS — R569 Unspecified convulsions: Secondary | ICD-10-CM | POA: Diagnosis not present

## 2018-01-22 MED ORDER — OXCARBAZEPINE 600 MG PO TABS: 600.0000 mg | ORAL_TABLET | Freq: Two times a day (BID) | ORAL | 4 refills | Status: DC

## 2018-01-22 MED ORDER — OXCARBAZEPINE 600 MG PO TABS
600.0000 mg | ORAL_TABLET | Freq: Two times a day (BID) | ORAL | 4 refills | Status: DC
Start: 2018-01-22 — End: 2018-01-22

## 2018-01-22 NOTE — Progress Notes (Signed)
NEUROLOGY CLINIC FOLLOW UP PATIENT NOTE  NAME: Daniel Cain DOB: 04-25-1941  HPI: Daniel Cain is a 77 y.o. male with PMH of HTN and HLD who is being seen in this office for seizure-like activity.  He had ER visit on 07/16/14 for episodic acute onset dizziness, foggy feeling in head, difficulty concentration and lightheadedness and N/V. Lasted about one hour and resolved. He had 4 similar episodes one month prior. No chest pain, palpitation, weakness or syncope. Had cardiology follow up with Dr. Jens Som in 08/2013, had nuclear stress study showed no ischemia. During the summertime, he had no recurrent episodes. However on 02/09/2015, he had similar episode again, he got up to bathroom, had nausea vomiting, has to lie down, resolved in about 4 hours. 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 During these episodes, he denies any headache, denies migraine history, no weakness, numbness, loss of consciousness, or hearing loss. He admits that recently he has some tinnitus in both ears, however mild, right more than left. He went to see ENT Dr. 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 There is no evidence of central vision. Dix-Hallpike and roll tests were negative, showing no indication of BPPV. He was referred here for further dilation.  05/12/15 follow up - pt had another episode of lightheadedness on 05/04/15. 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 He woke up in the morning feeling fine. He had MRI done  two days ago showed mid pontine small capillary telangectasia. MRA head and neck negative. EEG done this am. Wife stated that she can recall that for the last one year, they may started to use a new brand of detergent for laundry, not sure if this is associated with episodes if it is migraine equivalent. Also wife reported that he drinks wine a lot than he should.    08/04/15 follow up - pt had another episode on 07/12/15. He went to ER with hope to get a stat EEG to capture the episode. However, EEG was not done as pt symptoms nearly resolved and pt prefers no EEG at that time. His trileptal was increased to 600mg  bid. EEG done 05/12/15 in clinic showed normal EEG. Since the ER visit, pt has no more spells. Has not followed up with ENT yet. BP 117/75.  11/07/15 follow up - pt has been doing well. No recurrent episodes. He is taking 600mg  bid of trileptal. He also followed up with ENT and considered questionable meniere's disease and advised on low salt diet and diuretics. He is on lisinopril, HCTZ and coreg for HTN for a long time. His BP today 108/72.   05/09/2016 visit JX: During the interval time, he has been doing well. No recurrent episodes. He is still on trileptal 600mg  bid without side effects. Recent had physical with PCP was told normal. Will need to request recent labs. His BP today 155/91 and he said at home 120s/80s. He is following with PCP for BP management.   Interval history 01/22/2018: Patient is being seen today for follow-up  of seizures and medication refill.  He did have appointment scheduled last December but unfortunately was canceled due to inclement weather and was not rescheduled.  Patient continues to take Trileptal 600 mg twice daily and has been tolerating this well.  He denies any seizure-like activities or events.  Discussion about possibly weaning patient from Trileptal as he has been stable without seizure activity but due to patient continuing to work, driving and staying  active, it was decided to continue on Trileptal as he is tolerating well without side effects.  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  He continues to follow with PCP for yearly lab work and will be going back to see him this coming fall.  Previous lab work has been satisfactory per patient.  Blood pressure today satisfactory at 141/80.  Continues to take aspirin and Lipitor without side effects for stroke prevention.     Past Medical History:  Diagnosis Date  . Hypercholesteremia   . Hyperlipidemia   . Hypertension   . Left bundle branch block   . Nocturia   . Osteoarthritis   . Vertigo    Past Surgical History:  Procedure Laterality Date  . HERNIA REPAIR    . TOTAL HIP ARTHROPLASTY Bilateral    Family History  Problem Relation Age of Onset  . COPD Mother   . Stroke Mother   . Hypertension Father    Current Outpatient Medications  Medication Sig Dispense Refill  . aspirin EC 81 MG tablet Take 81 mg by mouth daily.    Marland Kitchen. aspirin EC 81 MG tablet Take by mouth.    Marland Kitchen. atorvastatin (LIPITOR) 80 MG tablet Take 1 tablet by mouth daily.    . carvedilol (COREG) 25 MG tablet Take 1 tablet by mouth 2 (two) times daily.    Marland Kitchen. lisinopril-hydrochlorothiazide (PRINZIDE,ZESTORETIC) 20-12.5 MG tablet Take 1 tablet by mouth daily.    Marland Kitchen. oxcarbazepine (TRILEPTAL) 600 MG tablet Take 1 tablet (600 mg total) by mouth 2 (two) times daily. 180 tablet 4   No current facility-administered medications for this visit.    No Known Allergies Social History   Socioeconomic History  . Marital status: Married    Spouse name: Not on file  . Number of children: 3  . Years of education: Not on file  . Highest education level: Not on file  Occupational History  . Not on file  Social Needs  . Financial resource strain: Not on file  . Food insecurity:    Worry: Not on file    Inability: Not on file  . Transportation needs:    Medical: Not on file    Non-medical: Not on file    Tobacco Use  . Smoking status: Former Games developermoker  . Smokeless tobacco: Never Used  Substance and Sexual Activity  . Alcohol use: Yes    Alcohol/week: 1.0 standard drinks    Types: 1 Glasses of wine per week    Comment: 1-2 glasses wine per day  . Drug use: No  . Sexual activity: Never    Birth control/protection: Abstinence  Lifestyle  . Physical activity:    Days per week: Not on file    Minutes per session: Not on file  . Stress: Not on file  Relationships  . Social connections:    Talks on phone: Not on file    Gets together: Not on file    Attends religious service: Not on file    Active member of club or  organization: Not on file    Attends meetings of clubs or organizations: Not on file    Relationship status: Not on file  . Intimate partner violence:    Fear of current or ex partner: Not on file    Emotionally abused: Not on file    Physically abused: Not on file    Forced sexual activity: Not on file  Other Topics Concern  . Not on file  Social History Narrative  . Not on file    Review of Systems Full 14 system review of systems performed and notable only for those listed, all others are neg:  No complaints   Physical Exam  Vitals:   01/22/18 0854  BP: (!) 141/80  Pulse: (!) 57    General - Well nourished, well developed, pleasant elderly Caucasian male, in no apparent distress.  Ophthalmologic - Sharp disc margins OU.  Cardiovascular - Regular rate and rhythm with no murmur.    Neck - supple, no nuchal rigidity .  Mental Status -  Level of arousal and orientation to time, place, and person were intact. Language including expression, naming, repetition, comprehension, reading, and writing was assessed and found intact. Attention span and concentration were normal. Recent and remote memory were intact. Fund of Knowledge was assessed and was intact.  Cranial Nerves II - XII - II - Visual field intact OU. III, IV, VI - Extraocular movements  intact. V - Facial sensation intact bilaterally. VII - Facial movement intact bilaterally. VIII - Hearing & vestibular intact bilaterally. X - Palate elevates symmetrically. XI - Chin turning & shoulder shrug intact bilaterally. XII - Tongue protrusion intact.  Motor Strength - The patient's strength was normal in all extremities and pronator drift was absent.  Bulk was normal and fasciculations were absent.   Motor Tone - Muscle tone was assessed at the neck and appendages and was normal.  Reflexes - The patient's reflexes were normal in all extremities and he had no pathological reflexes.  Sensory - Light touch, temperature/pinprick, vibration and proprioception, and Romberg testing were assessed and were normal.    Coordination - The patient had normal movements in the hands and feet with no ataxia or dysmetria.  Tremor was absent.  Gait and Station - The patient's transfers, posture, gait, station, and turns were observed as normal.   Imaging and labs  No recent imaging    Assessment:   Clayborn Milnes is a 77 year old male with PMH of HLD and HTN with possible seizure activity who was started on Trileptal low-dose but after additional possible seizure activity on 07/12/2015 Trileptal was increased to 600 mg twice daily.  Patient returns today for follow-up and medication refill.  Has been compliant with Trileptal and denies seizure activity.    Plan: - continue ASA 81 mg and lipitor for stroke prevention  - continue trileptal 600mg  twice a day for seizure prevention - f/u with PCP for management of HTN and HLD along with yearly labs -ensure sodium level satisfactory - check BP at home  -Advised to continue to stay active and maintain a healthy diet  - follow up in one year or call earlier if needed   Greater than 50% of time during this 25 minute visit was spent on counseling,explanation of diagnosis of seizures, reviewing risk factor management of HTN and HLD, planning  of further management, discussion with patient and family and coordination of care  George Hugh, AGNP-BC  [facility] [address] [place], [place]  [address]  Phone [phone] Fax [phone] Note: This document was prepared with digital dictation and possible smart phrase technology. Any transcriptional errors that result from this process are unintentional.

## 2018-01-22 NOTE — Patient Instructions (Addendum)
Your Plan:  Continue trileptal 600mg  twice a day  Ensure your primary doctor checks sodium level (typically with BMP or CMP)  Follow up in 1 year or call earlier if needed      Thank you for coming to see us at [facility]. I hope we have been able to provide you high quality care today.  You may receive a patient satisfaction survey over the next few weeks. We would appreciate your feedback and comments so that we may continue to improve ourselves and the health of our patients.

## 2018-01-23 NOTE — Progress Notes (Signed)
I agree with the above plan 

## 2019-01-20 ENCOUNTER — Telehealth: Payer: Self-pay | Admitting: Adult Health

## 2019-01-20 NOTE — Telephone Encounter (Signed)
error 

## 2019-01-25 ENCOUNTER — Ambulatory Visit: Payer: BC Managed Care – PPO | Admitting: Adult Health

## 2019-01-28 ENCOUNTER — Other Ambulatory Visit: Payer: Self-pay

## 2019-01-28 ENCOUNTER — Encounter: Payer: Self-pay | Admitting: Adult Health

## 2019-01-28 ENCOUNTER — Ambulatory Visit: Payer: BC Managed Care – PPO | Admitting: Adult Health

## 2019-01-28 VITALS — BP 144/85 | HR 52 | Temp 98.6°F | Ht 72.0 in | Wt 201.2 lb

## 2019-01-28 DIAGNOSIS — R569 Unspecified convulsions: Secondary | ICD-10-CM

## 2019-01-28 DIAGNOSIS — Z5181 Encounter for therapeutic drug level monitoring: Secondary | ICD-10-CM

## 2019-01-28 DIAGNOSIS — Z79899 Other long term (current) drug therapy: Secondary | ICD-10-CM

## 2019-01-28 MED ORDER — OXCARBAZEPINE 600 MG PO TABS: 600.0000 mg | ORAL_TABLET | Freq: Two times a day (BID) | ORAL | 3 refills | Status: DC

## 2019-01-28 NOTE — Patient Instructions (Signed)
Your Plan:  Continue current dose of Trileptal 600 mg twice daily for seizure prevention  We will check lab work today and possibly decrease dose as you have not had any kind of seizure activity since 2017   Follow-up in 1 year or call earlier if needed     Thank you for coming to see [place] at [facility]. I hope we have been able to provide you high quality care today.  You may receive a patient satisfaction survey over the next few weeks. We would appreciate your feedback and comments so that we may continue to improve ourselves and the health of our patients.

## 2019-01-28 NOTE — Progress Notes (Signed)
NEUROLOGY CLINIC FOLLOW UP PATIENT NOTE  NAME: Daniel Cain DOB: May 06, 1941   Chief Complaint  Patient presents with  . Follow-up    Yearly f/u. Alone. Rm 9. No new concerns at this time.      HPI: Daniel Cain is a 78 y.o. male with PMH of HTN and HLD who is being seen in this office for seizure-like activity.  He had ER visit on 07/16/14 for episodic acute onset dizziness, foggy feeling in head, difficulty concentration and lightheadedness and N/V. Lasted about one hour and resolved. He had 4 similar episodes one month prior. No chest pain, palpitation, weakness or syncope. Had cardiology follow up with Dr. Stanford Breed in 08/2013, had nuclear stress study showed no ischemia. During the summertime, he had no recurrent episodes. However on 02/09/2015, he had similar episode again, he got up to bathroom, had nausea vomiting, has to lie down, resolved in about 4 hours. 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 During these episodes, he denies any headache, denies migraine history, no weakness, numbness, loss of consciousness, or hearing loss. He admits that recently he has some tinnitus in both ears, however mild, right more than left. He went to see ENT Dr. 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 There is no evidence of central vision. Dix-Hallpike and roll tests were negative, showing no indication of BPPV. He was referred here for further dilation.  05/12/15 follow up - pt had another episode of lightheadedness on 05/04/15. He was off two meetings at 11:30am, started to have lightheadedness, little vertigo lasting briefly, with one spell of N/V, went home and went to bed,  episodes lasted about 3-4 hours but lethargy lasted the rest of the day. He woke up in the morning feeling fine. 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 MRA head and neck negative. EEG done this am. Wife stated that she can recall that for the last one year, they may started to use a new brand of detergent for laundry, not sure if this is associated with episodes if it is migraine equivalent. Also wife reported that he drinks wine a lot than he should.    08/04/15 follow up - pt had another episode on 07/12/15. He went to ER with hope to get a stat EEG to capture the episode. However, EEG was not done as pt symptoms nearly resolved and pt prefers no EEG at that time. His trileptal was increased to 600mg  bid. EEG done 05/12/15 in clinic showed normal EEG. Since the ER visit, pt has no more spells. Has not followed up with ENT yet. BP 117/75.  11/07/15 follow up - pt has been doing well. No recurrent episodes. He is taking 600mg  bid of trileptal. He also followed up with ENT and considered questionable meniere's disease and advised on low salt diet and diuretics. He is on lisinopril, HCTZ and coreg for HTN for a long time. His BP today 108/72.   05/09/2016 visit JX: During the interval time, he has been doing well. No recurrent episodes. He is still on trileptal 600mg  bid without side effects. Recent had physical with PCP was told normal. Will need to request recent labs. His  BP today 155/91 and he said at home 120s/80s. He is following with PCP for BP management.   Update 01/22/2018: Patient is being seen today for follow-up of seizures and medication refill.  He did have appointment scheduled last December but unfortunately was canceled due to inclement weather and was not rescheduled.  Patient continues to take Trileptal 600 mg twice daily and has been tolerating this well.  He denies any seizure-like activities or events.  Discussion about possibly weaning patient from  Trileptal as he has been stable without seizure activity but due to patient continuing to work, driving and staying active, it was decided to continue on Trileptal as he is tolerating well without side effects.  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  He continues to follow with PCP for yearly lab work and will be going back to see him this coming fall.  Previous lab work has been satisfactory per patient.  Blood pressure today satisfactory at 141/80.  Continues to take aspirin and Lipitor without side effects for stroke prevention.  Update 01/28/2019: Daniel Cain is being seen today for seizure follow-up and medication management.  He continues on Trileptal 600 mg twice daily tolerating well without recurrent seizure activity.  He is questioning potentially decreasing dose as he has been stable without seizure activity since 2017.  He continues to be active with working, driving and maintaining ADLs and IADLs independently.  He does continue to follow with PCP regularly and plans on having repeat lab work in December during his annual physical but he is not sure if Trileptal levels are checked at that time.  No concerns at this time.    Past Medical History:  Diagnosis Date  . Hypercholesteremia   . Hyperlipidemia   . Hypertension   . Left bundle branch block   . Nocturia   . Osteoarthritis   . Vertigo    Past Surgical History:  Procedure Laterality Date  . HERNIA REPAIR    . TOTAL HIP ARTHROPLASTY Bilateral    Family History  Problem Relation Age of Onset  . COPD Mother   . Stroke Mother   . Hypertension Father    Current Outpatient Medications  Medication Sig Dispense Refill  . aspirin EC 81 MG tablet Take 81 mg by mouth daily.    Marland Kitchen. atorvastatin (LIPITOR) 80 MG tablet Take 1 tablet by mouth daily.    . carvedilol (COREG) 25 MG tablet Take 1 tablet by mouth 2 (two) times daily.    Marland Kitchen. lisinopril-hydrochlorothiazide (PRINZIDE,ZESTORETIC) 20-12.5 MG tablet Take 1  tablet by mouth daily.    Marland Kitchen. oxcarbazepine (TRILEPTAL) 600 MG tablet Take 1 tablet (600 mg total) by mouth 2 (two) times daily. 180 tablet 3   No current facility-administered medications for this visit.    No Known Allergies Social History   Socioeconomic History  . Marital status: Married    Spouse name: Not on file  . Number of children: 3  . Years of education: Not on file  . Highest education level: Not on file  Occupational History  . Not on file  Social Needs  . Financial resource strain: Not on file  . Food insecurity    Worry: Not on file    Inability: Not on file  . Transportation needs    Medical: Not on file    Non-medical: Not on file  Tobacco Use  . Smoking status: Former Games developermoker  . Smokeless tobacco: Never Used  Substance and Sexual Activity  .  Alcohol use: Yes    Alcohol/week: 1.0 standard drinks    Types: 1 Glasses of wine per week    Comment: 1-2 glasses wine per day  . Drug use: No  . Sexual activity: Never    Birth control/protection: Abstinence  Lifestyle  . Physical activity    Days per week: Not on file    Minutes per session: Not on file  . Stress: Not on file  Relationships  . Social Musician on phone: Not on file    Gets together: Not on file    Attends religious service: Not on file    Active member of club or organization: Not on file    Attends meetings of clubs or organizations: Not on file    Relationship status: Not on file  . Intimate partner violence    Fear of current or ex partner: Not on file    Emotionally abused: Not on file    Physically abused: Not on file    Forced sexual activity: Not on file  Other Topics Concern  . Not on file  Social History Narrative  . Not on file    Review of Systems Full 14 system review of systems performed and notable only for those listed, all others are neg:  No complaints   Physical Exam  Vitals:   01/28/19 0845  BP: (!) 144/85  Pulse: (!) 52  Temp: 98.6 F (37 C)     General - Well nourished, well developed, pleasant elderly Caucasian male, in no apparent distress.  Ophthalmologic - Sharp disc margins OU.  Cardiovascular - Regular rate and rhythm with no murmur.    Neck - supple, no nuchal rigidity .  Mental Status -  Level of arousal and orientation to time, place, and person were intact. Language including expression, naming, repetition, comprehension, reading, and writing was assessed and found intact. Attention span and concentration were normal. Recent and remote memory were intact. Fund of Knowledge was assessed and was intact.  Cranial Nerves II - XII - II - Visual field intact OU. III, IV, VI - Extraocular movements intact. V - Facial sensation intact bilaterally. VII - Facial movement intact bilaterally. VIII - Hearing & vestibular intact bilaterally. X - Palate elevates symmetrically. XI - Chin turning & shoulder shrug intact bilaterally. XII - Tongue protrusion intact.  Motor Strength - The patient's strength was normal in all extremities and pronator drift was absent.  Bulk was normal and fasciculations were absent.   Motor Tone - Muscle tone was assessed at the neck and appendages and was normal.  Reflexes - The patient's reflexes were normal in all extremities and he had no pathological reflexes.  Sensory - Light touch, temperature/pinprick, vibration and proprioception, and Romberg testing were assessed and were normal.    Coordination - The patient had normal movements in the hands and feet with no ataxia or dysmetria.  Tremor was absent.  Gait and Station - The patient's transfers, posture, gait, station, and turns were observed as normal.   Imaging and labs  No recent imaging    Assessment:   Daniel Cain is a 78 year old male with PMH of HLD and HTN with possible seizure activity who was started on Trileptal low-dose but after additional possible seizure activity on 07/12/2015 Trileptal was increased to 600 mg  twice daily.  Patient returns today for follow-up and medication refill.  Has been compliant with Trileptal and denies seizure activity.  Questioning potential decreasing Trileptal dose as  he has been stable since 2017.    Plan: -Recommend obtaining Trileptal level with potential consideration of slowly decreasing dosage based on results.  Discussion with patient regarding potential recurrent seizure activity with decreasing dosage and verbalized understanding.  He will continue on Trileptal 600 mg twice daily at this time until lab work resulted - continue ASA 81 mg and lipitor for stroke prevention  - f/u with PCP for management of HTN and HLD along with yearly labs  - check BP at home  -Advised to continue to stay active and maintain a healthy diet  - follow up in one year or call earlier if needed   Greater than 50% of time during this 25 minute visit was spent on counseling,explanation of diagnosis of seizures, reviewing risk factor management of HTN and HLD, planning of further management, discussion with patient and family and coordination of care  George HughJessica (Jana Swartzlander), AGNP-BC  [facility] [address] [place], [place] [id number]  Phone [phone] Fax [phone] Note: This document was prepared with digital dictation and possible smart phrase technology. Any transcriptional errors that result from this process are unintentional.

## 2019-01-28 NOTE — Progress Notes (Signed)
I agree with the above plan 

## 2019-02-02 LAB — COMPREHENSIVE METABOLIC PANEL
ALT: 23 IU/L (ref 0–44)
AST: 21 IU/L (ref 0–40)
Albumin/Globulin Ratio: 2.2 (ref 1.2–2.2)
Albumin: 4 g/dL (ref 3.7–4.7)
Alkaline Phosphatase: 55 IU/L (ref 39–117)
BUN/Creatinine Ratio: 17 (ref 10–24)
BUN: 18 mg/dL (ref 8–27)
Bilirubin Total: 0.5 mg/dL (ref 0.0–1.2)
CO2: 21 mmol/L (ref 20–29)
Calcium: 8.8 mg/dL (ref 8.6–10.2)
Chloride: 105 mmol/L (ref 96–106)
Creatinine, Ser: 1.09 mg/dL (ref 0.76–1.27)
GFR calc Af Amer: 75 mL/min/{1.73_m2} (ref >59)
GFR calc non Af Amer: 65 mL/min/{1.73_m2} (ref >59)
Globulin, Total: 1.8 g/dL (ref 1.5–4.5)
Glucose: 107 mg/dL — ABNORMAL HIGH (ref 65–99)
Potassium: 4.3 mmol/L (ref 3.5–5.2)
Sodium: 140 mmol/L (ref 134–144)
Total Protein: 5.8 g/dL — ABNORMAL LOW (ref 6.0–8.5)

## 2019-02-02 LAB — 10-HYDROXYCARBAZEPINE: Oxcarbazepine SerPl-Mcnc: 23 ug/mL (ref 10–35)

## 2019-05-14 ENCOUNTER — Ambulatory Visit: Payer: BC Managed Care – PPO | Attending: Internal Medicine

## 2019-05-14 DIAGNOSIS — Z20822 Contact with and (suspected) exposure to covid-19: Secondary | ICD-10-CM

## 2019-05-15 LAB — NOVEL CORONAVIRUS, NAA: SARS-CoV-2, NAA: NOT DETECTED

## 2019-07-09 ENCOUNTER — Telehealth: Payer: Self-pay | Admitting: Cardiology

## 2019-07-09 NOTE — Telephone Encounter (Signed)
Received a call from Daniel Cain from Dr. Clarene Duke. She report MD ran and EKG strip due to pt complaining of SOB with exertion and requesting DOD review strip.  Strip reviewed by Dr. Cristal Deer (DOD) who made Dr. Clarene Duke aware report revealed a fib. Dr. Clarene Duke will have labs drawn and will advise pt to schedule an appointment.

## 2019-07-09 NOTE — Telephone Encounter (Signed)
Daniel Cain from [facility] calling stating Dr. Clarene Duke would like the DOD to review the patient's EKG.

## 2019-07-26 NOTE — H&P (View-Only) (Signed)
  Referring-Kevin Little, MD Reason for referral-atrial fibrillation  HPI: 79-year-old male for evaluation of atrial fibrillation at request of Kevin Little, MD.  Patient seen previously but not since April 2016. Cardiac catheterization April 2003 showed normal coronary arteries and ejection fraction 45 to 50%.  Nuclear study April 2016 showed ejection fraction 66%, artifact suggestive of left bundle branch block but no ischemia. Patient recently seen by Dr. Little with complaints of increased dyspnea on exertion.  Electrocardiogram showed atrial fibrillation.  Patient notes dyspnea with more vigorous activity since November.  He also has had palpitations at night.  He has some fatigue.  There is no orthopnea, PND, pedal edema, chest pain or syncope.  Current Outpatient Medications  Medication Sig Dispense Refill  . aspirin EC 81 MG tablet Take 81 mg by mouth daily.    . atorvastatin (LIPITOR) 80 MG tablet Take 1 tablet by mouth daily.    . carvedilol (COREG) 25 MG tablet Take 1 tablet by mouth 2 (two) times daily.    . lisinopril-hydrochlorothiazide (PRINZIDE,ZESTORETIC) 20-12.5 MG tablet Take 1 tablet by mouth daily.    . oxcarbazepine (TRILEPTAL) 600 MG tablet Take 1 tablet (600 mg total) by mouth 2 (two) times daily. 180 tablet 3  . ELIQUIS 5 MG TABS tablet Take 5 mg by mouth in the morning and at bedtime.     No current facility-administered medications for this visit.    No Known Allergies   Past Medical History:  Diagnosis Date  . Atrial fibrillation ([facility])   . Hyperlipidemia   . Hypertension   . Left bundle branch block   . Nocturia   . Osteoarthritis   . Vertigo     Past Surgical History:  Procedure Laterality Date  . HERNIA REPAIR    . TOTAL HIP ARTHROPLASTY Bilateral     Social History   Socioeconomic History  . Marital status: Married    Spouse name: Not on file  . Number of children: 3  . Years of education: Not on file  . Highest education level: Not on  file  Occupational History  . Not on file  Tobacco Use  . Smoking status: Former Smoker  . Smokeless tobacco: Never Used  Substance and Sexual Activity  . Alcohol use: Yes    Alcohol/week: 1.0 standard drinks    Types: 1 Glasses of wine per week    Comment: 1-2 glasses wine per day  . Drug use: No  . Sexual activity: Never    Birth control/protection: Abstinence  Other Topics Concern  . Not on file  Social History Narrative  . Not on file   Social Determinants of Health   Financial Resource Strain:   . Difficulty of Paying Living Expenses: Not on file  Food Insecurity:   . Worried About Running Out of Food in the Last Year: Not on file  . Ran Out of Food in the Last Year: Not on file  Transportation Needs:   . Lack of Transportation (Medical): Not on file  . Lack of Transportation (Non-Medical): Not on file  Physical Activity:   . Days of Exercise per Week: Not on file  . Minutes of Exercise per Session: Not on file  Stress:   . Feeling of Stress : Not on file  Social Connections:   . Frequency of Communication with Friends and Family: Not on file  . Frequency of Social Gatherings with Friends and Family: Not on file  . Attends Religious Services: Not on file  .   Active Member of Clubs or Organizations: Not on file  . Attends Banker Meetings: Not on file  . Marital Status: Not on file  Intimate Partner Violence:   . Fear of Current or Ex-Partner: Not on file  . Emotionally Abused: Not on file  . Physically Abused: Not on file  . Sexually Abused: Not on file    Family History  Problem Relation Age of Onset  . COPD Mother   . Stroke Mother   . Hypertension Father     ROS: no fevers or chills, productive cough, hemoptysis, dysphasia, odynophagia, melena, hematochezia, dysuria, hematuria, rash, seizure activity, orthopnea, PND, pedal edema, claudication. Remaining systems are negative.  Physical Exam:   Blood pressure (!) 142/85, pulse 91,  temperature 98 F (36.7 C), height 6' (1.829 m), weight 202 lb (91.6 kg).  General:  Well developed/well nourished in NAD Skin warm/dry Patient not depressed No peripheral clubbing Back-normal HEENT-normal/normal eyelids Neck supple/normal carotid upstroke bilaterally; no bruits; no JVD; no thyromegaly chest - CTA/ normal expansion CV - irregular/normal S1 and S2; no murmurs, rubs or gallops;  PMI nondisplaced Abdomen -NT/ND, no HSM, no mass, + bowel sounds, no bruit 2+ femoral pulses, positive bruit Ext-no edema, chords, 2+ DP Neuro-grossly nonfocal  ECG -July 09, 2019-atrial fibrillation, left bundle branch block.  Personally reviewed today's electrocardiogram personally reviewed and shows atrial fibrillation at a rate of 91 and left bundle branch block.  A/P  1 new onset atrial fibrillation-rate appears to be controlled.  Continue present dose of carvedilol. CHADSvasc 3.  We will continue apixaban.  Discontinue aspirin.  Schedule echocardiogram to assess LV function.  Check TSH CBC and bmet.  I discussed at length today rhythm control versus rate control.  He is having symptomatic dyspnea on exertion.  I therefore feel that rhythm control is best option.  We will make sure that he has been on apixaban for 3 consecutive weeks and then proceed with cardioversion.  If he does not hold sinus rhythm and he would require an antiarrhythmic versus consideration of ablation.  Patient in agreement.  2 hypertension-blood pressure controlled.  Continue present medications.  3 hyperlipidemia-continue statin.  4 snoring-patient states that he snores.  Will need sleep evaluation to exclude obstructive sleep apnea as a contributor to his atrial fibrillation.  5 bruit-schedule abdominal ultrasound to exclude aneurysm.  Olga Millers, MD

## 2019-07-26 NOTE — Progress Notes (Signed)
Referring-Kevin Little, MD Reason for referral-atrial fibrillation  HPI: 79 year old male for evaluation of atrial fibrillation at request of Hulan Fess, MD.  Patient seen previously but not since April 2016. Cardiac catheterization April 2003 showed normal coronary arteries and ejection fraction 45 to 50%.  Nuclear study April 2016 showed ejection fraction 66%, artifact suggestive of left bundle branch block but no ischemia. Patient recently seen by Dr. Rex Kras with complaints of increased dyspnea on exertion.  Electrocardiogram showed atrial fibrillation.  Patient notes dyspnea with more vigorous activity since November.  He also has had palpitations at night.  He has some fatigue.  There is no orthopnea, PND, pedal edema, chest pain or syncope.  Current Outpatient Medications  Medication Sig Dispense Refill  . aspirin EC 81 MG tablet Take 81 mg by mouth daily.    Marland Kitchen atorvastatin (LIPITOR) 80 MG tablet Take 1 tablet by mouth daily.    . carvedilol (COREG) 25 MG tablet Take 1 tablet by mouth 2 (two) times daily.    Marland Kitchen lisinopril-hydrochlorothiazide (PRINZIDE,ZESTORETIC) 20-12.5 MG tablet Take 1 tablet by mouth daily.    Marland Kitchen oxcarbazepine (TRILEPTAL) 600 MG tablet Take 1 tablet (600 mg total) by mouth 2 (two) times daily. 180 tablet 3  . ELIQUIS 5 MG TABS tablet Take 5 mg by mouth in the morning and at bedtime.     No current facility-administered medications for this visit.    No Known Allergies   Past Medical History:  Diagnosis Date  . Atrial fibrillation ([place])   . Hyperlipidemia   . Hypertension   . Left bundle branch block   . Nocturia   . Osteoarthritis   . Vertigo     Past Surgical History:  Procedure Laterality Date  . HERNIA REPAIR    . TOTAL HIP ARTHROPLASTY Bilateral     Social History   Socioeconomic History  . Marital status: Married    Spouse name: Not on file  . Number of children: 3  . Years of education: Not on file  . Highest education level: Not on  file  Occupational History  . Not on file  Tobacco Use  . Smoking status: Former Research scientist (life sciences)  . Smokeless tobacco: Never Used  Substance and Sexual Activity  . Alcohol use: Yes    Alcohol/week: 1.0 standard drinks    Types: 1 Glasses of wine per week    Comment: 1-2 glasses wine per day  . Drug use: No  . Sexual activity: Never    Birth control/protection: Abstinence  Other Topics Concern  . Not on file  Social History Narrative  . Not on file   Social Determinants of Health   Financial Resource Strain:   . Difficulty of Paying Living Expenses: Not on file  Food Insecurity:   . Worried About Charity fundraiser in the Last Year: Not on file  . Ran Out of Food in the Last Year: Not on file  Transportation Needs:   . Lack of Transportation (Medical): Not on file  . Lack of Transportation (Non-Medical): Not on file  Physical Activity:   . Days of Exercise per Week: Not on file  . Minutes of Exercise per Session: Not on file  Stress:   . Feeling of Stress : Not on file  Social Connections:   . Frequency of Communication with Friends and Family: Not on file  . Frequency of Social Gatherings with Friends and Family: Not on file  . Attends Religious Services: Not on file  .  Active Member of Clubs or Organizations: Not on file  . Attends Banker Meetings: Not on file  . Marital Status: Not on file  Intimate Partner Violence:   . Fear of Current or Ex-Partner: Not on file  . Emotionally Abused: Not on file  . Physically Abused: Not on file  . Sexually Abused: Not on file    Family History  Problem Relation Age of Onset  . COPD Mother   . Stroke Mother   . Hypertension Father     ROS: no fevers or chills, productive cough, hemoptysis, dysphasia, odynophagia, melena, hematochezia, dysuria, hematuria, rash, seizure activity, orthopnea, PND, pedal edema, claudication. Remaining systems are negative.  Physical Exam:   Blood pressure (!) 142/85, pulse 91,  temperature 98 F (36.7 C), height 6' (1.829 m), weight 202 lb (91.6 kg).  General:  Well developed/well nourished in NAD Skin warm/dry Patient not depressed No peripheral clubbing Back-normal HEENT-normal/normal eyelids Neck supple/normal carotid upstroke bilaterally; no bruits; no JVD; no thyromegaly chest - CTA/ normal expansion CV - irregular/normal S1 and S2; no murmurs, rubs or gallops;  PMI nondisplaced Abdomen -NT/ND, no HSM, no mass, + bowel sounds, no bruit 2+ femoral pulses, positive bruit Ext-no edema, chords, 2+ DP Neuro-grossly nonfocal  ECG -July 09, 2019-atrial fibrillation, left bundle branch block.  Personally reviewed today's electrocardiogram personally reviewed and shows atrial fibrillation at a rate of 91 and left bundle branch block.  A/P  1 new onset atrial fibrillation-rate appears to be controlled.  Continue present dose of carvedilol. CHADSvasc 3.  We will continue apixaban.  Discontinue aspirin.  Schedule echocardiogram to assess LV function.  Check TSH CBC and bmet.  I discussed at length today rhythm control versus rate control.  He is having symptomatic dyspnea on exertion.  I therefore feel that rhythm control is best option.  We will make sure that he has been on apixaban for 3 consecutive weeks and then proceed with cardioversion.  If he does not hold sinus rhythm and he would require an antiarrhythmic versus consideration of ablation.  Patient in agreement.  2 hypertension-blood pressure controlled.  Continue present medications.  3 hyperlipidemia-continue statin.  4 snoring-patient states that he snores.  Will need sleep evaluation to exclude obstructive sleep apnea as a contributor to his atrial fibrillation.  5 bruit-schedule abdominal ultrasound to exclude aneurysm.  Olga Millers, MD

## 2019-08-03 ENCOUNTER — Other Ambulatory Visit: Payer: Self-pay | Admitting: *Deleted

## 2019-08-03 ENCOUNTER — Other Ambulatory Visit: Payer: Self-pay

## 2019-08-03 ENCOUNTER — Encounter: Payer: Self-pay | Admitting: Cardiology

## 2019-08-03 ENCOUNTER — Ambulatory Visit: Payer: Medicare PPO | Admitting: Cardiology

## 2019-08-03 ENCOUNTER — Telehealth: Payer: Self-pay | Admitting: *Deleted

## 2019-08-03 VITALS — BP 142/85 | HR 91 | Temp 98.0°F | Ht 72.0 in | Wt 202.0 lb

## 2019-08-03 DIAGNOSIS — I4819 Other persistent atrial fibrillation: Secondary | ICD-10-CM

## 2019-08-03 DIAGNOSIS — E7800 Pure hypercholesterolemia, unspecified: Secondary | ICD-10-CM

## 2019-08-03 DIAGNOSIS — R0989 Other specified symptoms and signs involving the circulatory and respiratory systems: Secondary | ICD-10-CM | POA: Diagnosis not present

## 2019-08-03 DIAGNOSIS — R0683 Snoring: Secondary | ICD-10-CM

## 2019-08-03 DIAGNOSIS — I10 Essential (primary) hypertension: Secondary | ICD-10-CM | POA: Diagnosis not present

## 2019-08-03 LAB — BASIC METABOLIC PANEL
BUN/Creatinine Ratio: 22 (ref 10–24)
BUN: 27 mg/dL (ref 8–27)
CO2: 20 mmol/L (ref 20–29)
Calcium: 9 mg/dL (ref 8.6–10.2)
Chloride: 100 mmol/L (ref 96–106)
Creatinine, Ser: 1.25 mg/dL (ref 0.76–1.27)
GFR calc Af Amer: 64 mL/min/{1.73_m2} (ref >59)
GFR calc non Af Amer: 55 mL/min/{1.73_m2} — ABNORMAL LOW (ref >59)
Glucose: 104 mg/dL — ABNORMAL HIGH (ref 65–99)
Potassium: 4.9 mmol/L (ref 3.5–5.2)
Sodium: 136 mmol/L (ref 134–144)

## 2019-08-03 LAB — CBC
Hematocrit: 42.6 % (ref 37.5–51.0)
Hemoglobin: 14.6 g/dL (ref 13.0–17.7)
MCH: 32.5 pg (ref 26.6–33.0)
MCHC: 34.3 g/dL (ref 31.5–35.7)
MCV: 95 fL (ref 79–97)
Platelets: 196 10*3/uL (ref 150–450)
RBC: 4.49 x10E6/uL (ref 4.14–5.80)
RDW: 12 % (ref 11.6–15.4)
WBC: 6.3 10*3/uL (ref 3.4–10.8)

## 2019-08-03 LAB — TSH: TSH: 1.74 u[IU]/mL (ref 0.450–4.500)

## 2019-08-03 NOTE — Telephone Encounter (Signed)
PA for in lab sleep study faxed to [facility].

## 2019-08-03 NOTE — Patient Instructions (Signed)
Medication Instructions:  STOP ASPIRIN  *If you need a refill on your cardiac medications before your next appointment, please call your pharmacy*   Lab Work: Your physician recommends that you HAVE LAB WORK TODAY  If you have labs (blood work) drawn today and your tests are completely normal, you will receive your results only by: Marland Kitchen MyChart Message (if you have MyChart) OR . A paper copy in the mail If you have any lab test that is abnormal or we need to change your treatment, we will call you to review the results.   Testing/Procedures: Your physician has requested that you have an echocardiogram. Echocardiography is a painless test that uses sound waves to create images of your heart. It provides your doctor with information about the size and shape of your heart and how well your heart's chambers and valves are working. This procedure takes approximately one hour. There are no restrictions for this procedure.[address] [facility]  Your physician has requested that you have an abdominal aorta duplex. During this test, an ultrasound is used to evaluate the aorta. Allow 30 minutes for this exam. Do not eat after midnight the day before and avoid carbonated beverages [facility]   You are scheduled for a Cardioversion on Wednesday 09/01/19 with Dr. Bjorn Pippin.  Please arrive at the [facility] (Main Entrance A) at [facility]: [address] [place], [place] [address] at 7:30 am. (1 hour prior to procedure unless lab work is needed; if lab work is needed arrive 1.5 hours ahead)  DIET: Nothing to eat or drink after midnight except a sip of water with medications (see medication instructions below)  Medication Instructions:  Continue your anticoagulant: ELIQUIS  You must have a responsible person to drive you home and stay in the waiting area during your procedure. Failure to do so could result in cancellation.  Bring your insurance cards.  *Special Note: Every effort is  made to have your procedure done on time. Occasionally there are emergencies that occur at the hospital that may cause delays. Please be patient if a delay does occur.     Follow-Up: At [facility], you and your health needs are our priority.  As part of our continuing mission to provide you with exceptional heart care, we have created designated Provider Care Teams.  These Care Teams include your primary Cardiologist (physician) and Advanced Practice Providers (APPs -  Physician Assistants and Nurse Practitioners) who all work together to provide you with the care you need, when you need it.  We recommend signing up for the patient portal called "MyChart".  Sign up information is provided on this After Visit Summary.  MyChart is used to connect with patients for Virtual Visits (Telemedicine).  Patients are able to view lab/test results, encounter notes, upcoming appointments, etc.  Non-urgent messages can be sent to your provider as well.   To learn more about what you can do with MyChart, go to [email or web address].    Your next appointment:   8-12 week(s)  The format for your next appointment:   In Person  Provider:   Olga Millers, MD   Your physician has recommended that you have a sleep study. This test records several body functions during sleep, including: brain activity, eye movement, oxygen and carbon dioxide blood levels, heart rate and rhythm, breathing rate and rhythm, the flow of air through your mouth and nose, snoring, body muscle movements, and chest and belly movement.[facility]

## 2019-08-05 ENCOUNTER — Telehealth: Payer: Self-pay | Admitting: *Deleted

## 2019-08-05 NOTE — Telephone Encounter (Signed)
Left message on cell VM to return a call to discuss sleep study appointment details.

## 2019-08-06 ENCOUNTER — Other Ambulatory Visit: Payer: Self-pay

## 2019-08-06 ENCOUNTER — Ambulatory Visit (HOSPITAL_COMMUNITY)
Admission: RE | Admit: 2019-08-06 | Discharge: 2019-08-06 | Disposition: A | Discharge status: Home / Self Care | Payer: Medicare PPO | Source: Ambulatory Visit | Attending: Cardiology | Admitting: Cardiology

## 2019-08-06 ENCOUNTER — Ambulatory Visit (HOSPITAL_BASED_OUTPATIENT_CLINIC_OR_DEPARTMENT_OTHER): Payer: Medicare PPO

## 2019-08-06 DIAGNOSIS — I119 Hypertensive heart disease without heart failure: Secondary | ICD-10-CM | POA: Diagnosis not present

## 2019-08-06 DIAGNOSIS — E785 Hyperlipidemia, unspecified: Secondary | ICD-10-CM | POA: Insufficient documentation

## 2019-08-06 DIAGNOSIS — I77810 Thoracic aortic ectasia: Secondary | ICD-10-CM | POA: Insufficient documentation

## 2019-08-06 DIAGNOSIS — Z87891 Personal history of nicotine dependence: Secondary | ICD-10-CM

## 2019-08-06 DIAGNOSIS — R0602 Shortness of breath: Secondary | ICD-10-CM | POA: Diagnosis not present

## 2019-08-06 DIAGNOSIS — I081 Rheumatic disorders of both mitral and tricuspid valves: Secondary | ICD-10-CM | POA: Insufficient documentation

## 2019-08-06 DIAGNOSIS — R0989 Other specified symptoms and signs involving the circulatory and respiratory systems: Secondary | ICD-10-CM | POA: Insufficient documentation

## 2019-08-06 DIAGNOSIS — R002 Palpitations: Secondary | ICD-10-CM | POA: Insufficient documentation

## 2019-08-06 DIAGNOSIS — I4819 Other persistent atrial fibrillation: Secondary | ICD-10-CM

## 2019-08-12 ENCOUNTER — Telehealth: Payer: Self-pay | Admitting: *Deleted

## 2019-08-12 NOTE — Telephone Encounter (Signed)
Left message for pt to call.

## 2019-08-12 NOTE — Telephone Encounter (Signed)
-----   Message from [name], MD sent at 08/09/2019  7:11 AM EDT ----- LV function worse compared to previous; schedule fuov Olga Millers

## 2019-08-16 NOTE — Telephone Encounter (Signed)
Follow up scheduled

## 2019-08-24 ENCOUNTER — Other Ambulatory Visit (HOSPITAL_COMMUNITY): Payer: Medicare PPO

## 2019-08-25 ENCOUNTER — Other Ambulatory Visit (HOSPITAL_COMMUNITY)
Admission: RE | Admit: 2019-08-25 | Discharge: 2019-08-25 | Disposition: A | Discharge status: Home / Self Care | Payer: Medicare PPO | Source: Ambulatory Visit | Attending: Cardiovascular Disease | Admitting: Cardiovascular Disease

## 2019-08-25 DIAGNOSIS — Z20822 Contact with and (suspected) exposure to covid-19: Secondary | ICD-10-CM | POA: Insufficient documentation

## 2019-08-25 DIAGNOSIS — Z01812 Encounter for preprocedural laboratory examination: Secondary | ICD-10-CM | POA: Diagnosis present

## 2019-08-25 LAB — SARS CORONAVIRUS 2 (TAT 6-24 HRS): SARS Coronavirus 2: NEGATIVE

## 2019-08-27 ENCOUNTER — Other Ambulatory Visit: Payer: Self-pay

## 2019-08-27 ENCOUNTER — Ambulatory Visit (HOSPITAL_BASED_OUTPATIENT_CLINIC_OR_DEPARTMENT_OTHER): Payer: Medicare PPO | Attending: Cardiology | Admitting: Cardiovascular Disease

## 2019-08-27 DIAGNOSIS — G4733 Obstructive sleep apnea (adult) (pediatric): Secondary | ICD-10-CM

## 2019-08-27 DIAGNOSIS — Z79899 Other long term (current) drug therapy: Secondary | ICD-10-CM | POA: Diagnosis not present

## 2019-08-27 DIAGNOSIS — Z7901 Long term (current) use of anticoagulants: Secondary | ICD-10-CM | POA: Insufficient documentation

## 2019-08-27 DIAGNOSIS — I4891 Unspecified atrial fibrillation: Secondary | ICD-10-CM | POA: Insufficient documentation

## 2019-08-27 DIAGNOSIS — I4819 Other persistent atrial fibrillation: Secondary | ICD-10-CM

## 2019-08-27 DIAGNOSIS — R0683 Snoring: Secondary | ICD-10-CM | POA: Diagnosis not present

## 2019-08-27 DIAGNOSIS — R0902 Hypoxemia: Secondary | ICD-10-CM | POA: Insufficient documentation

## 2019-08-27 DIAGNOSIS — G4761 Periodic limb movement disorder: Secondary | ICD-10-CM | POA: Insufficient documentation

## 2019-09-01 ENCOUNTER — Ambulatory Visit (HOSPITAL_COMMUNITY): Payer: Medicare PPO | Admitting: Certified Registered"

## 2019-09-01 ENCOUNTER — Encounter (HOSPITAL_COMMUNITY)
Admission: RE | Disposition: A | Discharge status: Home / Self Care | Payer: Self-pay | Source: Home / Self Care | Attending: Cardiology

## 2019-09-01 ENCOUNTER — Ambulatory Visit (HOSPITAL_COMMUNITY)
Admission: RE | Admit: 2019-09-01 | Discharge: 2019-09-01 | Disposition: A | Discharge status: Home / Self Care | Payer: Medicare PPO | Attending: Cardiology | Admitting: Cardiology

## 2019-09-01 ENCOUNTER — Encounter (HOSPITAL_COMMUNITY): Payer: Self-pay | Admitting: Cardiology

## 2019-09-01 ENCOUNTER — Other Ambulatory Visit: Payer: Self-pay

## 2019-09-01 DIAGNOSIS — I4819 Other persistent atrial fibrillation: Secondary | ICD-10-CM | POA: Diagnosis not present

## 2019-09-01 DIAGNOSIS — E785 Hyperlipidemia, unspecified: Secondary | ICD-10-CM | POA: Diagnosis not present

## 2019-09-01 DIAGNOSIS — Z8249 Family history of ischemic heart disease and other diseases of the circulatory system: Secondary | ICD-10-CM | POA: Diagnosis not present

## 2019-09-01 DIAGNOSIS — Z7901 Long term (current) use of anticoagulants: Secondary | ICD-10-CM | POA: Diagnosis not present

## 2019-09-01 DIAGNOSIS — Z79899 Other long term (current) drug therapy: Secondary | ICD-10-CM | POA: Insufficient documentation

## 2019-09-01 DIAGNOSIS — Z87891 Personal history of nicotine dependence: Secondary | ICD-10-CM | POA: Diagnosis not present

## 2019-09-01 DIAGNOSIS — M1990 Unspecified osteoarthritis, unspecified site: Secondary | ICD-10-CM | POA: Diagnosis not present

## 2019-09-01 DIAGNOSIS — R0683 Snoring: Secondary | ICD-10-CM | POA: Diagnosis not present

## 2019-09-01 DIAGNOSIS — Z7982 Long term (current) use of aspirin: Secondary | ICD-10-CM | POA: Diagnosis not present

## 2019-09-01 DIAGNOSIS — I4891 Unspecified atrial fibrillation: Secondary | ICD-10-CM | POA: Insufficient documentation

## 2019-09-01 DIAGNOSIS — I10 Essential (primary) hypertension: Secondary | ICD-10-CM | POA: Diagnosis not present

## 2019-09-01 DIAGNOSIS — Z96643 Presence of artificial hip joint, bilateral: Secondary | ICD-10-CM | POA: Diagnosis not present

## 2019-09-01 DIAGNOSIS — R0989 Other specified symptoms and signs involving the circulatory and respiratory systems: Secondary | ICD-10-CM | POA: Diagnosis not present

## 2019-09-01 DIAGNOSIS — I447 Left bundle-branch block, unspecified: Secondary | ICD-10-CM | POA: Diagnosis not present

## 2019-09-01 HISTORY — PX: CARDIOVERSION: SHX1299

## 2019-09-01 SURGERY — CARDIOVERSION
Anesthesia: General

## 2019-09-01 MED ORDER — SODIUM CHLORIDE 0.9 % IV SOLN: INTRAVENOUS | Status: AC | PRN

## 2019-09-01 MED ORDER — LIDOCAINE 2% (20 MG/ML) 5 ML SYRINGE
INTRAMUSCULAR | Status: DC | PRN
  Administered 2019-09-01: 40 mg via INTRAVENOUS

## 2019-09-01 MED ORDER — PROPOFOL 10 MG/ML IV BOLUS
INTRAVENOUS | Status: DC | PRN
  Administered 2019-09-01: 50 mg via INTRAVENOUS

## 2019-09-01 NOTE — Transfer of Care (Signed)
Immediate Anesthesia Transfer of Care Note  Patient: Daniel Cain  Procedure(s) Performed: CARDIOVERSION (N/A )  Patient Location: Endoscopy Unit  Anesthesia Type:MAC  Level of Consciousness: awake, alert  and oriented  Airway & Oxygen Therapy: Patient Spontanous Breathing and Patient connected to nasal cannula oxygen  Post-op Assessment: Report given to RN, Post -op Vital signs reviewed and stable and Patient moving all extremities  Post vital signs: Reviewed and stable  Last Vitals:  Vitals Value Taken Time  BP    Temp    Pulse    Resp    SpO2      Last Pain:  Vitals:   09/01/19 0800  TempSrc: Oral  PainSc: 0-No pain         Complications: No apparent anesthesia complications

## 2019-09-01 NOTE — CV Procedure (Signed)
Procedure:   DCCV  Indication:  Symptomatic atrial fibrillation  Procedure Note:  The patient signed informed consent.  They have had therapeutic anticoagulation with Eliquis greater than 3 weeks.  Anesthesia was administered by Dr. Chaney Malling.  Adequate airway was maintained throughout and vital followed per protocol.  They were cardioverted x 1 with 200J of biphasic synchronized energy.  They converted to NSR with rate 50s.  There were no apparent complications.  The patient had normal neuro status and respiratory status post procedure with vitals stable as recorded elsewhere.    Follow up:  They will continue on current medical therapy and follow up with cardiology as scheduled.  Epifanio Lesches, MD 09/01/2019 8:42 AM

## 2019-09-01 NOTE — Anesthesia Preprocedure Evaluation (Signed)
Anesthesia Evaluation  Patient identified by MRN, date of birth, ID band Patient awake    Reviewed: Allergy & Precautions, H&P , NPO status , Patient's Chart, lab work & pertinent test results  Airway Mallampati: II   Neck ROM: full    Dental   Pulmonary former smoker,    breath sounds clear to auscultation       Cardiovascular hypertension, + dysrhythmias Atrial Fibrillation  Rhythm:irregular Rate:Normal  LBBB Takes eliquis   Neuro/Psych    GI/Hepatic   Endo/Other    Renal/GU      Musculoskeletal  (+) Arthritis ,   Abdominal   Peds  Hematology   Anesthesia Other Findings   Reproductive/Obstetrics                             Anesthesia Physical Anesthesia Plan  ASA: III  Anesthesia Plan: General   Post-op Pain Management:    Induction: Intravenous  PONV Risk Score and Plan: 2 and Treatment may vary due to age or medical condition and Propofol infusion  Airway Management Planned: Mask  Additional Equipment:   Intra-op Plan:   Post-operative Plan:   Informed Consent: I have reviewed the patients History and Physical, chart, labs and discussed the procedure including the risks, benefits and alternatives for the proposed anesthesia with the patient or authorized representative who has indicated his/her understanding and acceptance.       Plan Discussed with: CRNA, Anesthesiologist and Surgeon  Anesthesia Plan Comments:         Anesthesia Quick Evaluation

## 2019-09-01 NOTE — Interval H&P Note (Signed)
History and Physical Interval Note:  09/01/2019 8:17 AM  Daniel Cain  has presented today for surgery, with the diagnosis of A-FIB.  The various methods of treatment have been discussed with the patient and family. After consideration of risks, benefits and other options for treatment, the patient has consented to  Procedure(s): CARDIOVERSION (N/A) as a surgical intervention.  The patient's history has been reviewed, patient examined, no change in status, stable for surgery.  I have reviewed the patient's chart and labs.  Questions were answered to the patient's satisfaction.     Daniel Cain

## 2019-09-01 NOTE — Anesthesia Procedure Notes (Signed)
Procedure Name: MAC Date/Time: 09/01/2019 8:39 AM Performed by: Amadeo Garnet, CRNA Pre-anesthesia Checklist: Patient identified, Emergency Drugs available, Suction available and Patient being monitored Patient Re-evaluated:Patient Re-evaluated prior to induction Oxygen Delivery Method: Ambu bag Preoxygenation: Pre-oxygenation with 100% oxygen Induction Type: IV induction Placement Confirmation: positive ETCO2 Dental Injury: Teeth and Oropharynx as per pre-operative assessment

## 2019-09-03 DIAGNOSIS — E7800 Pure hypercholesterolemia, unspecified: Secondary | ICD-10-CM | POA: Diagnosis not present

## 2019-09-03 DIAGNOSIS — I4891 Unspecified atrial fibrillation: Secondary | ICD-10-CM | POA: Diagnosis not present

## 2019-09-03 DIAGNOSIS — I447 Left bundle-branch block, unspecified: Secondary | ICD-10-CM | POA: Diagnosis not present

## 2019-09-03 DIAGNOSIS — R569 Unspecified convulsions: Secondary | ICD-10-CM | POA: Diagnosis not present

## 2019-09-03 DIAGNOSIS — N401 Enlarged prostate with lower urinary tract symptoms: Secondary | ICD-10-CM | POA: Diagnosis not present

## 2019-09-03 DIAGNOSIS — Z23 Encounter for immunization: Secondary | ICD-10-CM | POA: Diagnosis not present

## 2019-09-03 DIAGNOSIS — Z0000 Encounter for general adult medical examination without abnormal findings: Secondary | ICD-10-CM | POA: Diagnosis not present

## 2019-09-03 DIAGNOSIS — R7301 Impaired fasting glucose: Secondary | ICD-10-CM | POA: Diagnosis not present

## 2019-09-03 DIAGNOSIS — I10 Essential (primary) hypertension: Secondary | ICD-10-CM | POA: Diagnosis not present

## 2019-09-03 DIAGNOSIS — H6121 Impacted cerumen, right ear: Secondary | ICD-10-CM | POA: Diagnosis not present

## 2019-09-03 NOTE — Anesthesia Postprocedure Evaluation (Signed)
Anesthesia Post Note  Patient: Daniel Cain  Procedure(s) Performed: CARDIOVERSION (N/A )     Patient location during evaluation: Endoscopy Anesthesia Type: General Level of consciousness: awake and alert Pain management: pain level controlled Vital Signs Assessment: post-procedure vital signs reviewed and stable Respiratory status: spontaneous breathing, nonlabored ventilation, respiratory function stable and patient connected to nasal cannula oxygen Cardiovascular status: blood pressure returned to baseline and stable Postop Assessment: no apparent nausea or vomiting Anesthetic complications: no    Last Vitals:  Vitals:   09/01/19 0847 09/01/19 0856  BP: 94/60 92/63  Pulse: (!) 50 (!) 49  Resp: (!) 21 16  Temp: 37.2 C   SpO2: 99% 97%    Last Pain:  Vitals:   09/01/19 0856  TempSrc:   PainSc: 0-No pain                 Mali Eppard S

## 2019-09-04 ENCOUNTER — Encounter (HOSPITAL_BASED_OUTPATIENT_CLINIC_OR_DEPARTMENT_OTHER): Payer: Self-pay | Admitting: Cardiovascular Disease

## 2019-09-04 NOTE — Procedures (Signed)
Patient Name: Daniel Cain, Daniel Cain Date: 08/27/2019 Gender: Male D.O.B: April 08, 1941 Age (years): 78 Referring Provider: Kirk Ruths Height (inches): 72 Interpreting Physician: Shelva Majestic MD, ABSM Weight (lbs): 198 RPSGT: Earney Hamburg BMI: 27 MRN: [id number] Neck Size: 17.00  CLINICAL INFORMATION Sleep Study Type: NPSG  Indication for sleep study: Atrial fibrillation, snoring,   Epworth Sleepiness Score: 4  SLEEP STUDY TECHNIQUE As per the AASM Manual for the Scoring of Sleep and Associated Events v2.3 (April 2016) with a hypopnea requiring 4% desaturations.  The channels recorded and monitored were frontal, central and occipital EEG, electrooculogram (EOG), submentalis EMG (chin), nasal and oral airflow, thoracic and abdominal wall motion, anterior tibialis EMG, snore microphone, electrocardiogram, and pulse oximetry.  MEDICATIONS atorvastatin (LIPITOR) 80 MG tablet carvedilol (COREG) 25 MG tablet ELIQUIS 5 MG TABS tablet lisinopril (ZESTRIL) 20 MG tablet oxcarbazepine (TRILEPTAL) 600 MG tablet  Medications self-administered by patient taken the night of the study : ELIQUIS, [place] The study was initiated at 10:07:27 PM and ended at 4:20:32 AM.  Sleep onset time was 54.4 minutes and the sleep efficiency was 44.0%%. The total sleep time was 164 minutes.  Stage REM latency was 102.5 minutes.  The patient spent 8.2%% of the night in stage N1 sleep, 78.0%% in stage N2 sleep, 0.0%% in stage N3 and 13.7% in REM.  Alpha intrusion was absent.  Supine sleep was 18.88%.  RESPIRATORY PARAMETERS The overall apnea/hypopnea index (AHI) was 6.6 per hour. The respiratory disturbance index (RDI) was 8.0/h). There were 0 total apneas, including 0 obstructive, 0 central and 0 mixed apneas. There were 18 hypopneas and 4 RERAs.  The AHI during Stage REM sleep was 13.3 per hour.  AHI while supine was 19.4 per hour.  The mean oxygen  saturation was 91.2%. The minimum SpO2 during sleep was 85.0%.  Moderate snoring was noted during this study.  CARDIAC DATA The 2 lead EKG demonstrated sinus rhythm. The mean heart rate was 71.6 beats per minute. Other EKG findings include: Atrial Fibrillation.  LEG MOVEMENT DATA The total PLMS were 0 with a resulting PLMS index of 0.0. Associated arousal with leg movement index was 7.7 .  IMPRESSIONS - Mild obstructive sleep apnea ocerall (AHI 6.6/h: RDI 8.0/h); however, events were moderate during REM sleep (AHI 13.3/h) and with supine sleep (AHI 19.4/h).  - No significant central sleep apnea occurred during this study (CAI = 0.0/h). - Mild oxygen desaturation to a nadir of 85%. - The patient snored with moderate snoring volume. - Reduced sleep efficiency at only 44.0%.  - EKG findings include Atrial Fibrillation. - Clinically significant periodic limb movements did not occur during sleep. Associated arousals were significant.  DIAGNOSIS - Obstructive Sleep Apnea (327.23 [G47.33 ICD-10]) - Periodic Limb Movement During Sleep (327.51 [G47.61 ICD-10]) - Nocturnal Hypoxemia (327.26 [G47.36 ICD-10])  RECOMMENDATIONS - In this patient with significant cardiovascular comorbidities recommend therapeutic CPAP titration to optimally treat his sleep disordered breathing. - Effort should be made to optimize nasal and oropharyngeal patency. - Positional therapy avoiding supine position during sleep. - If patient is symptomatic with restless legs on CPAP, consider a trial of pharmocologic therapy for treatment of Periodic Leg Movements of Sleep. - Avoid alcohol, sedatives and other CNS depressants that may worsen sleep apnea and disrupt normal sleep architecture. - Sleep hygiene should be reviewed to assess factors that may improve sleep quality. - Weight management and regular exercise should be initiated or continued if appropriate.  [Electronically signed] 09/04/2019 11:10  AM  Nicki Guadalajara  MD, [facility], ABSM Diplomate, American Board of Sleep Medicine   NPI: [id number] [facility] PH: [phone]   FX: [phone] ACCREDITED BY THE AMERICAN ACADEMY OF SLEEP MEDICINE

## 2019-09-14 NOTE — Progress Notes (Signed)
HPI: FU atrial fibrillation. Cardiac catheterization April 2003 showed normal coronary arteries and ejection fraction 45 to 50%.  Nuclear study April 2016 showed ejection fraction 66%, artifact suggestive of left bundle branch block but no ischemia. Patient previously seen by Dr. Clarene Duke with complaints of increased dyspnea on exertion.  Electrocardiogram showed atrial fibrillation.  Echocardiogram March 2021 showed ejection fraction 25 to 30%, mild RV dysfunction and mild right ventricular enlargement, severe biatrial enlargement, mild to moderate mitral regurgitation and tricuspid regurgitation and mildly dilated aortic root.  Abdominal ultrasound March 2021 showed no aneurysm.  Patient had successful cardioversion on September 01, 2019.  Since last seen he has dyspnea with walking up hills but no orthopnea, PND, pedal edema, chest pain or syncope.  Occasional brief palpitations.  Current Outpatient Medications  Medication Sig Dispense Refill  . atorvastatin (LIPITOR) 80 MG tablet Take 80 mg by mouth daily.     . carvedilol (COREG) 25 MG tablet Take 25 mg by mouth 2 (two) times daily.     Marland Kitchen ELIQUIS 5 MG TABS tablet Take 5 mg by mouth in the morning and at bedtime.    Marland Kitchen lisinopril (ZESTRIL) 20 MG tablet Take 20 mg by mouth daily.    Marland Kitchen oxcarbazepine (TRILEPTAL) 600 MG tablet Take 1 tablet (600 mg total) by mouth 2 (two) times daily. 180 tablet 3   No current facility-administered medications for this visit.     Past Medical History:  Diagnosis Date  . Atrial fibrillation (HCC)   . Hyperlipidemia   . Hypertension   . Left bundle branch block   . Nocturia   . Osteoarthritis   . Vertigo     Past Surgical History:  Procedure Laterality Date  . CARDIOVERSION N/A 09/01/2019   Procedure: CARDIOVERSION;  Surgeon: Little Ishikawa, MD;  Location: [facility] ENDOSCOPY;  Service: Cardiovascular;  Laterality: N/A;  . HERNIA REPAIR    . TOTAL HIP ARTHROPLASTY Bilateral     Social History    Socioeconomic History  . Marital status: Married    Spouse name: Not on file  . Number of children: 3  . Years of education: Not on file  . Highest education level: Not on file  Occupational History  . Not on file  Tobacco Use  . Smoking status: Former Games developer  . Smokeless tobacco: Never Used  Substance and Sexual Activity  . Alcohol use: Yes    Alcohol/week: 1.0 standard drinks    Types: 1 Glasses of wine per week    Comment: 1-2 glasses wine per day  . Drug use: No  . Sexual activity: Never    Birth control/protection: Abstinence  Other Topics Concern  . Not on file  Social History Narrative  . Not on file   Social Determinants of Health   Financial Resource Strain:   . Difficulty of Paying Living Expenses:   Food Insecurity:   . Worried About Programme researcher, broadcasting/film/video in the Last Year:   . Barista in the Last Year:   Transportation Needs:   . Freight forwarder (Medical):   Marland Kitchen Lack of Transportation (Non-Medical):   Physical Activity:   . Days of Exercise per Week:   . Minutes of Exercise per Session:   Stress:   . Feeling of Stress :   Social Connections:   . Frequency of Communication with Friends and Family:   . Frequency of Social Gatherings with Friends and Family:   . Attends Religious Services:   .  Active Member of Clubs or Organizations:   . Attends Archivist Meetings:   Marland Kitchen Marital Status:   Intimate Partner Violence:   . Fear of Current or Ex-Partner:   . Emotionally Abused:   Marland Kitchen Physically Abused:   . Sexually Abused:     Family History  Problem Relation Age of Onset  . COPD Mother   . Stroke Mother   . Hypertension Father     ROS: no fevers or chills, productive cough, hemoptysis, dysphasia, odynophagia, melena, hematochezia, dysuria, hematuria, rash, seizure activity, orthopnea, PND, pedal edema, claudication. Remaining systems are negative.  Physical Exam: Well-developed well-nourished in no acute distress.  Skin is warm  and dry.  HEENT is normal.  Neck is supple.  Chest is clear to auscultation with normal expansion.  Cardiovascular exam is irregular Abdominal exam nontender or distended. No masses palpated. Extremities show no edema. neuro grossly intact  ECG-atrial fibrillation at a rate of 76, left bundle branch block.  Personally reviewed  A/P  1 new onset atrial fibrillation-patient is back in atrial fibrillation this morning.  He has some symptoms including mild dyspnea with walking up hills.  He would like to reestablish sinus rhythm.  I will add amiodarone 200 mg twice daily for 2 weeks then 200 mg daily thereafter.  Once fully loaded we will repeat cardioversion and hopefully he will remain in sinus rhythm.  If not we can consider referral for ablation.  Continue apixaban and carvedilol.    2 cardiomyopathy-etiology unclear.  Question related to atrial fibrillation and tachycardia.  Continue carvedilol.  Discontinue lisinopril.  In 48 hours we will begin Entresto 24/26 twice daily.  Titrate medications as tolerated.  We also will plan cardioversion as outlined above.  Would plan to repeat echocardiogram 3 months later.  If LV function reduced would need to repeat ischemia evaluation.    3 hypertension-blood pressure elevated.  We are changing lisinopril to [facility].  Follow blood pressure and adjust regimen as needed.  4 hyperlipidemia-continue statin.  5 snoring-presently being evaluated for possible sleep apnea.  Add CPAP as needed.  Kirk Ruths, MD

## 2019-09-16 ENCOUNTER — Telehealth: Payer: Self-pay | Admitting: *Deleted

## 2019-09-16 NOTE — Telephone Encounter (Signed)
-----   Message from Hiren A Kelly, MD sent at 09/04/2019 11:17 AM EDT ----- °Wanda, please notify pt and schedule for CPAP titration study. °

## 2019-09-20 ENCOUNTER — Encounter: Payer: Self-pay | Admitting: Cardiology

## 2019-09-20 ENCOUNTER — Ambulatory Visit: Payer: Medicare PPO | Admitting: Cardiology

## 2019-09-20 ENCOUNTER — Other Ambulatory Visit: Payer: Self-pay

## 2019-09-20 VITALS — BP 146/96 | HR 76 | Ht 72.0 in | Wt 197.0 lb

## 2019-09-20 DIAGNOSIS — E7800 Pure hypercholesterolemia, unspecified: Secondary | ICD-10-CM

## 2019-09-20 DIAGNOSIS — I4819 Other persistent atrial fibrillation: Secondary | ICD-10-CM | POA: Diagnosis not present

## 2019-09-20 DIAGNOSIS — I10 Essential (primary) hypertension: Secondary | ICD-10-CM | POA: Diagnosis not present

## 2019-09-20 MED ORDER — AMIODARONE HCL 200 MG PO TABS: 200.0000 mg | ORAL_TABLET | Freq: Every day | ORAL | 3 refills | Status: DC

## 2019-09-20 MED ORDER — ENTRESTO 24-26 MG PO TABS: 1.0000 | ORAL_TABLET | Freq: Two times a day (BID) | ORAL | 6 refills | Status: DC

## 2019-09-20 MED ORDER — AMIODARONE HCL 200 MG PO TABS: 200.0000 mg | ORAL_TABLET | Freq: Two times a day (BID) | ORAL | 0 refills | Status: DC

## 2019-09-20 NOTE — Patient Instructions (Signed)
Medication Instructions:  STOP LISINOPRIL.   START ENTRESTO 24-26 TWICE DAILY ON Wednesday 09/22/2019 MORNING.   START AMIODARONE 200MG  TWICE A DAY FOR TWO WEEKS. THEN DECREASE TO ONE TABLET DAILY.   *If you need a refill on your cardiac medications before your next appointment, please call your pharmacy*   Lab Work: If you have labs (blood work) drawn today and your tests are completely normal, you will receive your results only by: MyChart Message (if you have MyChart) OR . A paper copy in the mail If you have any lab test that is abnormal or we need to change your treatment, we will call you to review the results.   Follow-Up: At [facility], you and your health needs are our priority.  As part of our continuing mission to provide you with exceptional heart care, we have created designated Provider Care Teams.  These Care Teams include your primary Cardiologist (physician) and Advanced Practice Providers (APPs -  Physician Assistants and Nurse Practitioners) who all work together to provide you with the care you need, when you need it.  We recommend signing up for the patient portal called "MyChart".  Sign up information is provided on this After Visit Summary.  MyChart is used to connect with patients for Virtual Visits (Telemedicine).  Patients are able to view lab/test results, encounter notes, upcoming appointments, etc.  Non-urgent messages can be sent to your provider as well.   To learn more about what you can do with MyChart, go to [facility].    Your next appointment:   4-6  week(s)  The format for your next appointment:   In Person  Provider:   [email or web address], MD

## 2019-10-05 ENCOUNTER — Encounter: Payer: Self-pay | Admitting: Adult Health

## 2019-10-05 ENCOUNTER — Ambulatory Visit: Payer: Medicare PPO | Admitting: Adult Health

## 2019-10-05 ENCOUNTER — Other Ambulatory Visit: Payer: Self-pay

## 2019-10-05 VITALS — BP 126/89 | HR 55 | Temp 97.3°F | Ht 72.0 in | Wt 201.0 lb

## 2019-10-05 DIAGNOSIS — Z5181 Encounter for therapeutic drug level monitoring: Secondary | ICD-10-CM

## 2019-10-05 DIAGNOSIS — I4891 Unspecified atrial fibrillation: Secondary | ICD-10-CM

## 2019-10-05 DIAGNOSIS — R42 Dizziness and giddiness: Secondary | ICD-10-CM | POA: Diagnosis not present

## 2019-10-05 DIAGNOSIS — H8190 Unspecified disorder of vestibular function, unspecified ear: Secondary | ICD-10-CM

## 2019-10-05 NOTE — Progress Notes (Signed)
I have reviewed and agreed above plan. 

## 2019-10-05 NOTE — Progress Notes (Signed)
NEUROLOGY CLINIC FOLLOW UP PATIENT NOTE  NAME: Daniel SIEFRING DOB: 09/05/1940    Chief complaint: Chief Complaint  Patient presents with  . Follow-up    recurrent episode of lightheadedness, nausea and vomiting, similar presensation to prior episodes. new dx of atrial fibrillation     HPI:  Today, 10/05/2019, Daniel Cain returns by patient request accompanied by his wife due to recent onset of lightheadedness, vertigo and nausea. Prior visit 01/28/2019 stable at that time. Routine follow-up being seen for prior similar episodes initially evaluated by Dr. Roda Shutters with unclear etiology and Ddx vestibular paroxysmia vs basilar type migraine vs mid pontine small capillary telangectasia related (as demonstrated on MRI). Prior work-up negative for BPPV, stroke or intracranial stenosis. Evaluation by ENT consider questionable Mnire's disease. Trileptal initiated in 04/2015 with only one recurrent episode in 06/2015 and has been stable on 600 mg twice daily dosing without recurrent episodes until recently. He had recurrent symptoms of initially feeling lightheaded and then progressing to room spinning, imbalance and difficulty standing eventually accompanied by nausea and vomiting. Symptoms typically last 4 to 6 hours and are debilitating where he will typically lay down and go to sleep. Resolution of symptoms by the time he wakes up. He also experienced an additional episode on 10/01/2019. He endorses ongoing compliance with Trileptal 600 mg twice daily. He was recently diagnosed with atrial fibrillation in 06/2019 with initiation of apixaban and undergoing cardioversion on 09/01/2019 initially converted but unfortunately shortly returned back into atrial fibrillation. Atrial fibrillation found after visit with PCP, Dr. Clarene Duke, for complaints of dyspnea on exertion. During recurrent episodes, he denies dyspnea, chest pains, migraine or headache, palpitations or syncope.    ROS: Review of Systems    Constitutional: Negative.   HENT: Negative.   Eyes: Negative.   Respiratory: Negative.   Cardiovascular: Negative.   Gastrointestinal: Positive for nausea and vomiting.  Genitourinary: Negative.   Musculoskeletal: Negative.   Skin: Negative.   Neurological: Positive for dizziness.  Endo/Heme/Allergies: Negative.   Psychiatric/Behavioral: Negative.        Past Medical History:  Diagnosis Date  . Atrial fibrillation (HCC)   . Hyperlipidemia   . Hypertension   . Left bundle branch block   . Nocturia   . Osteoarthritis   . Vertigo    Past Surgical History:  Procedure Laterality Date  . CARDIOVERSION N/A 09/01/2019   Procedure: CARDIOVERSION;  Surgeon: Little Ishikawa, MD;  Location: [facility] ENDOSCOPY;  Service: Cardiovascular;  Laterality: N/A;  . HERNIA REPAIR    . TOTAL HIP ARTHROPLASTY Bilateral    Family History  Problem Relation Age of Onset  . COPD Mother   . Stroke Mother   . Hypertension Father    Current Outpatient Medications  Medication Sig Dispense Refill  . amiodarone (PACERONE) 200 MG tablet Take 1 tablet (200 mg total) by mouth 2 (two) times daily. 28 tablet 0  . amiodarone (PACERONE) 200 MG tablet Take 1 tablet (200 mg total) by mouth daily. 90 tablet 3  . atorvastatin (LIPITOR) 80 MG tablet Take 80 mg by mouth daily.     . carvedilol (COREG) 25 MG tablet Take 25 mg by mouth 2 (two) times daily.     Marland Kitchen ELIQUIS 5 MG TABS tablet Take 5 mg by mouth in the morning and at bedtime.    Marland Kitchen oxcarbazepine (TRILEPTAL) 600 MG tablet Take 1 tablet (600 mg total) by mouth 2 (two) times daily. 180 tablet 3  . sacubitril-valsartan (ENTRESTO) 24-26 MG Take  1 tablet by mouth 2 (two) times daily. 60 tablet 6   No current facility-administered medications for this visit.   No Known Allergies Social History   Socioeconomic History  . Marital status: Married    Spouse name: Not on file  . Number of children: 3  . Years of education: Not on file  . Highest education  level: Not on file  Occupational History  . Not on file  Tobacco Use  . Smoking status: Former Games developer  . Smokeless tobacco: Never Used  Substance and Sexual Activity  . Alcohol use: Yes    Alcohol/week: 1.0 standard drinks    Types: 1 Glasses of wine per week    Comment: 1-2 glasses wine per day  . Drug use: No  . Sexual activity: Never    Birth control/protection: Abstinence  Other Topics Concern  . Not on file  Social History Narrative  . Not on file   Social Determinants of Health   Financial Resource Strain:   . Difficulty of Paying Living Expenses:   Food Insecurity:   . Worried About Programme researcher, broadcasting/film/video in the Last Year:   . Barista in the Last Year:   Transportation Needs:   . Freight forwarder (Medical):   Marland Kitchen Lack of Transportation (Non-Medical):   Physical Activity:   . Days of Exercise per Week:   . Minutes of Exercise per Session:   Stress:   . Feeling of Stress :   Social Connections:   . Frequency of Communication with Friends and Family:   . Frequency of Social Gatherings with Friends and Family:   . Attends Religious Services:   . Active Member of Clubs or Organizations:   . Attends Banker Meetings:   Marland Kitchen Marital Status:   Intimate Partner Violence:   . Fear of Current or Ex-Partner:   . Emotionally Abused:   Marland Kitchen Physically Abused:   . Sexually Abused:      Physical Exam  Today's Vitals   10/05/19 1421  BP: 126/89  Pulse: (!) 55  Temp: (!) 97.3 F (36.3 C)  Weight: 201 lb (91.2 kg)  Height: 6' (1.829 m)   Body mass index is 27.26 kg/m.   General - Well nourished, well developed, pleasant elderly Caucasian male, in no apparent distress.  Ophthalmologic - Sharp disc margins OU.  Cardiovascular - Irregular rate and rhythm  Neck - supple, no nuchal rigidity .  Mental Status -  Level of arousal and orientation to time, place, and person were intact. Language including expression, naming, repetition,  comprehension, reading, and writing was assessed and found intact. Attention span and concentration were normal. Recent and remote memory were intact. Fund of Knowledge was assessed and was intact.  Cranial Nerves II - XII - II - Visual field intact OU. III, IV, VI - Extraocular movements intact. V - Facial sensation intact bilaterally. VII - Facial movement intact bilaterally. VIII - Hearing & vestibular intact bilaterally. X - Palate elevates symmetrically. XI - Chin turning & shoulder shrug intact bilaterally. XII - Tongue protrusion intact.  Motor Strength - The patient's strength was normal in all extremities and pronator drift was absent.  Bulk was normal and fasciculations were absent.   Motor Tone - Muscle tone was assessed at the neck and appendages and was normal.  Reflexes - The patient's reflexes were normal in all extremities and he had no pathological reflexes.  Sensory - Light touch, temperature/pinprick, vibration and proprioception, and  Romberg testing were assessed and were normal.    Coordination - The patient had normal movements in the hands and feet with no ataxia or dysmetria.  Tremor was absent.  Gait and Station - The patient's transfers, posture, gait, station, and turns were observed as normal.      Assessment:   Daniel Cain is a 79 year old male with PMH of HLD and HTN with recurrent episodes consisting of lightheadedness, dizziness, nausea and vomiting who was started on Trileptal low-dose in 04/2015. Additional episode 06/2015 with Trileptal dosage increased to 600 mg twice daily without recurrent episodes. Questionable Mnire's disease by ENT. Per Dr. Erlinda Hong, Ddx includes vestibular paroxymia vs basilar migraine vs mid pontine small capillary telangectasia related. Recent diagnosis of atrial fibrillation in 06/2019 undergoing unsuccessful cardioversion 08/2019. He also had recent diagnosis of mild sleep apnea and recommended initiating CPAP but has not had  follow-up regarding results. He presents today with 2 additional episodes since prior visit in 05/2019 and recently on 10/01/2019. Presentation similar to previous episodes.    Plan: -Long discussion with patient and wife regarding unknown etiology behind recurrent episodes. Advised to speak further with cardiology regarding possible atrial fibrillation being a contributing factor especially as no recurrent episodes since 2017 and recurrent episodes shortly prior to atrial fibrillation diagnosis. Discussion regarding possible benefit of long-term monitoring for possible correlation but this would be further decided by cardiology. If no relation, would recommend increasing Trileptal dosage and possibly use of Valium as needed for acute vertigo episodes. He opted to speak to cardiology first prior to medication changes. Will obtain Trileptal level in case dosage adjustment is indicated in the future -No indication for further work-up or imaging as symptoms consistent to prior episodes -New diagnosis of sleep apnea and recommended initiating CPAP. Advised to discuss further with cardiology if titration study is indicated or if he could be started on AutoPap. Discussion regarding importance of sleep apnea management for cardiovascular risk, recent diagnosis of atrial fibrillation and stroke prevention -Continue Eliquis and ongoing follow-up with cardiology for new onset atrial fibrillation - f/u with PCP for management of HTN and HLD  -Advised to continue to stay active and maintain a healthy diet   Follow-up in September as previously scheduled or call earlier if needed   I spent 45 minutes of face-to-face and non-face-to-face time with patient and wife.  This included previsit chart review, lab review, study review, order entry, electronic health record documentation, patient education regarding recurrent episodes, possible etiologies, recent diagnosis of atrial fibrillation and sleep apnea, and answered  all questions to patient and wife satisfaction   Frann Rider, AGNP-BC  [facility] [address] [place], [place] [address]  Phone [phone] Fax [phone] Note: This document was prepared with digital dictation and possible smart phrase technology. Any transcriptional errors that result from this process are unintentional.

## 2019-10-05 NOTE — Patient Instructions (Addendum)
Your Plan:  Would recommend speaking to Dr. Jens Som to see if your symptoms could be due to atrial fibrillation  If not, may consider increasing trileptal   Will obtain lab work to ensure satisfactory levels of trileptal incase dosage needs to be increased   Follow up with cardiology regarding recent sleep apnea testing - appears as though they want you to proceed with titration study. Speak to Dr. Jens Som regarding doing autoPAP vs going in for titration study    Follow up in September as scheduled or call earlier if needed      Thank you for coming to see [place] at [facility]. I hope we have been able to provide you high quality care today.  You may receive a patient satisfaction survey over the next few weeks. We would appreciate your feedback and comments so that we may continue to improve ourselves and the health of our patients.

## 2019-10-07 ENCOUNTER — Other Ambulatory Visit: Payer: Self-pay | Admitting: Adult Health

## 2019-10-07 ENCOUNTER — Encounter: Payer: Self-pay | Admitting: Adult Health

## 2019-10-07 DIAGNOSIS — R569 Unspecified convulsions: Secondary | ICD-10-CM

## 2019-10-07 LAB — 10-HYDROXYCARBAZEPINE: Oxcarbazepine SerPl-Mcnc: 24 ug/mL (ref 10–35)

## 2019-10-07 MED ORDER — OXCARBAZEPINE 600 MG PO TABS: ORAL_TABLET | ORAL | 3 refills | Status: DC

## 2019-10-08 NOTE — Progress Notes (Signed)
    HPI: FU atrial fibrillation. Cardiac catheterization April 2003 showed normal coronary arteries and ejection fraction 45 to 50%. Nuclearstudy April 2016 showed ejection fraction 66%, artifact suggestive of left bundle branch block but no ischemia. Patient previously seen by Dr. Little with complaints of increased dyspnea on exertion. Electrocardiogram showed atrial fibrillation.  Echocardiogram March 2021 showed ejection fraction 25 to 30%, mild RV dysfunction and mild right ventricular enlargement, severe biatrial enlargement, mild to moderate mitral regurgitation and tricuspid regurgitation and mildly dilated aortic root.  Abdominal ultrasound March 2021 showed no aneurysm.  Patient had successful cardioversion on September 01, 2019.    At time of follow-up he was back in atrial fibrillation.  We initiated amiodarone and he returns today.  Since last seen  he continues to have dyspnea with more vigorous activities but not routine activities.  No orthopnea, PND or pedal edema.  No chest pain or syncope.  Current Outpatient Medications  Medication Sig Dispense Refill  . amiodarone (PACERONE) 200 MG tablet Take 1 tablet (200 mg total) by mouth 2 (two) times daily. 28 tablet 0  . amiodarone (PACERONE) 200 MG tablet Take 1 tablet (200 mg total) by mouth daily. 90 tablet 3  . atorvastatin (LIPITOR) 80 MG tablet Take 80 mg by mouth daily.     . carvedilol (COREG) 25 MG tablet Take 25 mg by mouth 2 (two) times daily.     . ELIQUIS 5 MG TABS tablet Take 5 mg by mouth in the morning and at bedtime.    . oxcarbazepine (TRILEPTAL) 600 MG tablet Take 1 tablet (600 mg total) by mouth in the morning AND 1.5 tablets (900 mg total) at bedtime. 225 tablet 3  . sacubitril-valsartan (ENTRESTO) 24-26 MG Take 1 tablet by mouth 2 (two) times daily. 60 tablet 6   No current facility-administered medications for this visit.     Past Medical History:  Diagnosis Date  . Atrial fibrillation (HCC)   . Hyperlipidemia    . Hypertension   . Left bundle branch block   . Nocturia   . Osteoarthritis   . Vertigo     Past Surgical History:  Procedure Laterality Date  . CARDIOVERSION N/A 09/01/2019   Procedure: CARDIOVERSION;  Surgeon: Schumann, Christopher L, MD;  Location: [facility] ENDOSCOPY;  Service: Cardiovascular;  Laterality: N/A;  . HERNIA REPAIR    . TOTAL HIP ARTHROPLASTY Bilateral     Social History   Socioeconomic History  . Marital status: Married    Spouse name: Not on file  . Number of children: 3  . Years of education: Not on file  . Highest education level: Not on file  Occupational History  . Not on file  Tobacco Use  . Smoking status: Former Smoker  . Smokeless tobacco: Never Used  Substance and Sexual Activity  . Alcohol use: Yes    Alcohol/week: 1.0 standard drinks    Types: 1 Glasses of wine per week    Comment: 1-2 glasses wine per day  . Drug use: No  . Sexual activity: Never    Birth control/protection: Abstinence  Other Topics Concern  . Not on file  Social History Narrative  . Not on file   Social Determinants of Health   Financial Resource Strain:   . Difficulty of Paying Living Expenses:   Food Insecurity:   . Worried About Running Out of Food in the Last Year:   . Ran Out of Food in the Last Year:   Transportation Needs:   .   Lack of Transportation (Medical):   . Lack of Transportation (Non-Medical):   Physical Activity:   . Days of Exercise per Week:   . Minutes of Exercise per Session:   Stress:   . Feeling of Stress :   Social Connections:   . Frequency of Communication with Friends and Family:   . Frequency of Social Gatherings with Friends and Family:   . Attends Religious Services:   . Active Member of Clubs or Organizations:   . Attends Club or Organization Meetings:   . Marital Status:   Intimate Partner Violence:   . Fear of Current or Ex-Partner:   . Emotionally Abused:   . Physically Abused:   . Sexually Abused:     Family History    Problem Relation Age of Onset  . COPD Mother   . Stroke Mother   . Hypertension Father     ROS: no fevers or chills, productive cough, hemoptysis, dysphasia, odynophagia, melena, hematochezia, dysuria, hematuria, rash, seizure activity, orthopnea, PND, pedal edema, claudication. Remaining systems are negative.  Physical Exam: Well-developed well-nourished in no acute distress.  Skin is warm and dry.  HEENT is normal.  Neck is supple.  Chest is clear to auscultation with normal expansion.  Cardiovascular exam is regular rate and rhythm.  Abdominal exam nontender or distended. No masses palpated. Extremities show no edema. neuro grossly intact  ECG-probable ectopic atrial tachycardia versus slow atrial flutter, left bundle branch block.  Personally reviewed  A/P  1 persistent atrial fibrillation-patient is now in ectopic atrial tachycardia versus atrial flutter.  He remains symptomatic with dyspnea with more vigorous activities which she did not have in the past.  We will continue amiodarone 200 mg daily.  In 3 weeks we will plan to proceed with repeat cardioversion.  Hopefully the addition of amiodarone will help maintain sinus rhythm.  He has been compliant with apixaban and has not missed doses.   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  Continue Entresto and carvedilol.  We will plan to repeat echocardiogram 3 months after sinus has been reestablished.  If LV function remains reduced would need ischemia evaluation.  3 hypertension-blood pressure controlled.  Continue present medications.  4 hyperlipidemia-continue statin.  5 history of snoring-being evaluated for sleep apnea.  Brian Crenshaw, MD    

## 2019-10-08 NOTE — H&P (View-Only) (Signed)
HPI: FU atrial fibrillation. Cardiac catheterization April 2003 showed normal coronary arteries and ejection fraction 45 to 50%. Nuclearstudy April 2016 showed ejection fraction 66%, artifact suggestive of left bundle branch block but no ischemia. Patient previously seen by Dr. Rex Kras with complaints of increased dyspnea on exertion. Electrocardiogram showed atrial fibrillation.  Echocardiogram March 2021 showed ejection fraction 25 to 30%, mild RV dysfunction and mild right ventricular enlargement, severe biatrial enlargement, mild to moderate mitral regurgitation and tricuspid regurgitation and mildly dilated aortic root.  Abdominal ultrasound March 2021 showed no aneurysm.  Patient had successful cardioversion on September 01, 2019.    At time of follow-up he was back in atrial fibrillation.  We initiated amiodarone and he returns today.  Since last seen  he continues to have dyspnea with more vigorous activities but not routine activities.  No orthopnea, PND or pedal edema.  No chest pain or syncope.  Current Outpatient Medications  Medication Sig Dispense Refill  . amiodarone (PACERONE) 200 MG tablet Take 1 tablet (200 mg total) by mouth 2 (two) times daily. 28 tablet 0  . amiodarone (PACERONE) 200 MG tablet Take 1 tablet (200 mg total) by mouth daily. 90 tablet 3  . atorvastatin (LIPITOR) 80 MG tablet Take 80 mg by mouth daily.     . carvedilol (COREG) 25 MG tablet Take 25 mg by mouth 2 (two) times daily.     Marland Kitchen ELIQUIS 5 MG TABS tablet Take 5 mg by mouth in the morning and at bedtime.    Marland Kitchen oxcarbazepine (TRILEPTAL) 600 MG tablet Take 1 tablet (600 mg total) by mouth in the morning AND 1.5 tablets (900 mg total) at bedtime. 225 tablet 3  . sacubitril-valsartan (ENTRESTO) 24-26 MG Take 1 tablet by mouth 2 (two) times daily. 60 tablet 6   No current facility-administered medications for this visit.     Past Medical History:  Diagnosis Date  . Atrial fibrillation ([place])   . Hyperlipidemia    . Hypertension   . Left bundle branch block   . Nocturia   . Osteoarthritis   . Vertigo     Past Surgical History:  Procedure Laterality Date  . CARDIOVERSION N/A 09/01/2019   Procedure: CARDIOVERSION;  Surgeon: Donato Heinz, MD;  Location: [facility] ENDOSCOPY;  Service: Cardiovascular;  Laterality: N/A;  . HERNIA REPAIR    . TOTAL HIP ARTHROPLASTY Bilateral     Social History   Socioeconomic History  . Marital status: Married    Spouse name: Not on file  . Number of children: 3  . Years of education: Not on file  . Highest education level: Not on file  Occupational History  . Not on file  Tobacco Use  . Smoking status: Former Research scientist (life sciences)  . Smokeless tobacco: Never Used  Substance and Sexual Activity  . Alcohol use: Yes    Alcohol/week: 1.0 standard drinks    Types: 1 Glasses of wine per week    Comment: 1-2 glasses wine per day  . Drug use: No  . Sexual activity: Never    Birth control/protection: Abstinence  Other Topics Concern  . Not on file  Social History Narrative  . Not on file   Social Determinants of Health   Financial Resource Strain:   . Difficulty of Paying Living Expenses:   Food Insecurity:   . Worried About Charity fundraiser in the Last Year:   . Arboriculturist in the Last Year:   Transportation Needs:   .  Lack of Transportation (Medical):   Marland Kitchen Lack of Transportation (Non-Medical):   Physical Activity:   . Days of Exercise per Week:   . Minutes of Exercise per Session:   Stress:   . Feeling of Stress :   Social Connections:   . Frequency of Communication with Friends and Family:   . Frequency of Social Gatherings with Friends and Family:   . Attends Religious Services:   . Active Member of Clubs or Organizations:   . Attends Banker Meetings:   Marland Kitchen Marital Status:   Intimate Partner Violence:   . Fear of Current or Ex-Partner:   . Emotionally Abused:   Marland Kitchen Physically Abused:   . Sexually Abused:     Family History    Problem Relation Age of Onset  . COPD Mother   . Stroke Mother   . Hypertension Father     ROS: no fevers or chills, productive cough, hemoptysis, dysphasia, odynophagia, melena, hematochezia, dysuria, hematuria, rash, seizure activity, orthopnea, PND, pedal edema, claudication. Remaining systems are negative.  Physical Exam: Well-developed well-nourished in no acute distress.  Skin is warm and dry.  HEENT is normal.  Neck is supple.  Chest is clear to auscultation with normal expansion.  Cardiovascular exam is regular rate and rhythm.  Abdominal exam nontender or distended. No masses palpated. Extremities show no edema. neuro grossly intact  ECG-probable ectopic atrial tachycardia versus slow atrial flutter, left bundle branch block.  Personally reviewed  A/P  1 persistent atrial fibrillation-patient is now in ectopic atrial tachycardia versus atrial flutter.  He remains symptomatic with dyspnea with more vigorous activities which she did not have in the past.  We will continue amiodarone 200 mg daily.  In 3 weeks we will plan to proceed with repeat cardioversion.  Hopefully the addition of amiodarone will help maintain sinus rhythm.  He has been compliant with apixaban and has not missed doses.   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  Continue Entresto and carvedilol.  We will plan to repeat echocardiogram 3 months after sinus has been reestablished.  If LV function remains reduced would need ischemia evaluation.  3 hypertension-blood pressure controlled.  Continue present medications.  4 hyperlipidemia-continue statin.  5 history of snoring-being evaluated for sleep apnea.  Olga Millers, MD

## 2019-10-15 ENCOUNTER — Ambulatory Visit: Payer: Medicare PPO | Admitting: Cardiology

## 2019-10-15 ENCOUNTER — Other Ambulatory Visit: Payer: Self-pay

## 2019-10-15 ENCOUNTER — Encounter: Payer: Self-pay | Admitting: Cardiology

## 2019-10-15 VITALS — BP 128/84 | HR 105 | Ht 72.0 in | Wt 200.4 lb

## 2019-10-15 DIAGNOSIS — I4819 Other persistent atrial fibrillation: Secondary | ICD-10-CM | POA: Diagnosis not present

## 2019-10-15 DIAGNOSIS — E7800 Pure hypercholesterolemia, unspecified: Secondary | ICD-10-CM | POA: Diagnosis not present

## 2019-10-15 DIAGNOSIS — I10 Essential (primary) hypertension: Secondary | ICD-10-CM

## 2019-10-15 NOTE — Patient Instructions (Signed)
Medication Instructions:  NO CHANGE *If you need a refill on your cardiac medications before your next appointment, please call your pharmacy*   Lab Work: If you have labs (blood work) drawn today and your tests are completely normal, you will receive your results only by: Marland Kitchen MyChart Message (if you have MyChart) OR . A paper copy in the mail If you have any lab test that is abnormal or we need to change your treatment, we will call you to review the results.   Testing/Procedures:  You are scheduled for a Cardioversion on Friday 11-05-19 with Dr. Rennis Golden.  Please arrive at the [facility] (Main Entrance A) at [facility]: [address] [place], [place] [address] at 8 am. (1 hour prior to procedure unless lab work is needed; if lab work is needed arrive 1.5 hours ahead)  DIET: Nothing to eat or drink after midnight except a sip of water with medications (see medication instructions below)  Medication Instructions: DO NOT TAKE CARVEDILOL Thursday EVENING OR Friday MORNING  Continue your anticoagulant: ELIQUIS   GO TO [address] ON Tuesday 11-02-19 @ 11:30 AM FOR COVID TESTING  You must have a responsible person to drive you home and stay in the waiting area during your procedure. Failure to do so could result in cancellation.  Bring your insurance cards.  *Special Note: Every effort is made to have your procedure done on time. Occasionally there are emergencies that occur at the hospital that may cause delays. Please be patient if a delay does occur.      Follow-Up: At [facility], you and your health needs are our priority.  As part of our continuing mission to provide you with exceptional heart care, we have created designated Provider Care Teams.  These Care Teams include your primary Cardiologist (physician) and Advanced Practice Providers (APPs -  Physician Assistants and Nurse Practitioners) who all work together to provide you with the care you need, when you need  it.  We recommend signing up for the patient portal called "MyChart".  Sign up information is provided on this After Visit Summary.  MyChart is used to connect with patients for Virtual Visits (Telemedicine).  Patients are able to view lab/test results, encounter notes, upcoming appointments, etc.  Non-urgent messages can be sent to your provider as well.   To learn more about what you can do with MyChart, go to [email or web address].    Your next appointment:   3 month(s)  The format for your next appointment:   In Person  Provider:   Olga Millers, MD

## 2019-10-29 ENCOUNTER — Telehealth: Payer: Self-pay | Admitting: Cardiology

## 2019-10-29 NOTE — Telephone Encounter (Signed)
Called patient- he states that he would miss out on the part of the week for the summer camp-  He would like to move the Cardioversion until after June 18th if possible.   I advised with patient that I would have to check to make sure he would be okay to wait for his cardioversion.   Patient appreciative of call back, and will await answer.   Will route to MD and nurse.   Thank you!

## 2019-10-29 NOTE — Telephone Encounter (Signed)
Spoke with pt, aware procedure rescheduled to 11-17-19 @ 10 am. He is aware of covid testing and instructions.

## 2019-10-29 NOTE — Telephone Encounter (Signed)
Patient wanted to know if it was absolutely necessary for him to self quarantine between his COVID Test and his Cardioversion.  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 The Patient is scheduled to have a COVID test June 8 and he would have to miss a fair amount of the first week of the program if he had to quarantine.  He said he has much more flexibility with his schedule after June 18th. He wanted to know if there was a possibility that the procedure could be postponed until after the 18th

## 2019-11-01 ENCOUNTER — Telehealth: Payer: Self-pay | Admitting: *Deleted

## 2019-11-01 ENCOUNTER — Telehealth: Payer: Self-pay | Admitting: Cardiology

## 2019-11-01 NOTE — Telephone Encounter (Signed)
Spoke with pt, DCCV changed to 11/05/19 @ 10:30am. covid testing also rescheduled.

## 2019-11-01 NOTE — Telephone Encounter (Signed)
Follow up   Pt is calling to follow up   Please advise

## 2019-11-01 NOTE — Telephone Encounter (Signed)
Informed patient of sleep study results and patient understanding was verbalized. Patient understands his sleep study showed Mild obstructive sleep apnea ocerall (AHI 6.6/h: RDI 8.0/h); however, events were moderate during REM sleep (AHI 13.3/h) and with supine sleep (AHI 19.4/h).   Patient understands his: DIAGNOSIS - Obstructive Sleep Apnea (327.23 [G47.33 ICD-10]) - Periodic Limb Movement During Sleep (327.51 [G47.61 ICD-10]) - Nocturnal Hypoxemia (327.26 [G47.36 ICD-10])  RECOMMENDATIONS - In this patient with significant cardiovascular comorbidities recommend therapeutic CPAP titration to optimally treat his sleep disordered breathing.  Titration sent to sleep pool

## 2019-11-01 NOTE — Telephone Encounter (Signed)
-----   Message from Lennette Bihari, MD sent at 09/04/2019 11:17 AM EDT ----- Daniel Cain, please notify pt and schedule for CPAP titration study.

## 2019-11-01 NOTE — Telephone Encounter (Signed)
Please advise?  I know you had worked on getting this switched.  Thanks!

## 2019-11-01 NOTE — Telephone Encounter (Signed)
Patient called answering service and would like to know if appointment for cardioversion could be scheduled for 6/11 instead of 6/23. He states he was able to work out his schedule to do procedure earlier. Please call to discuss.

## 2019-11-02 ENCOUNTER — Other Ambulatory Visit (HOSPITAL_COMMUNITY)
Admission: RE | Admit: 2019-11-02 | Discharge: 2019-11-02 | Disposition: A | Discharge status: Home / Self Care | Payer: Medicare PPO | Source: Ambulatory Visit | Attending: Cardiology | Admitting: Cardiology

## 2019-11-02 ENCOUNTER — Other Ambulatory Visit (HOSPITAL_COMMUNITY): Payer: Medicare PPO

## 2019-11-02 DIAGNOSIS — Z01812 Encounter for preprocedural laboratory examination: Secondary | ICD-10-CM | POA: Insufficient documentation

## 2019-11-02 DIAGNOSIS — Z20822 Contact with and (suspected) exposure to covid-19: Secondary | ICD-10-CM | POA: Diagnosis not present

## 2019-11-02 LAB — SARS CORONAVIRUS 2 (TAT 6-24 HRS): SARS Coronavirus 2: NEGATIVE

## 2019-11-04 ENCOUNTER — Encounter (HOSPITAL_COMMUNITY): Payer: Self-pay | Admitting: Certified Registered Nurse Anesthetist

## 2019-11-04 NOTE — Progress Notes (Signed)
Attempted pre call for cardioversion tomorrow. Patient did not answer, and had a generic VM-did not leave a message.

## 2019-11-05 ENCOUNTER — Ambulatory Visit (HOSPITAL_COMMUNITY)
Admission: RE | Admit: 2019-11-05 | Discharge: 2019-11-05 | Disposition: A | Discharge status: Home / Self Care | Payer: Medicare PPO | Attending: Cardiology | Admitting: Cardiology

## 2019-11-05 ENCOUNTER — Encounter (HOSPITAL_COMMUNITY)
Admission: RE | Disposition: A | Discharge status: Home / Self Care | Payer: Self-pay | Source: Home / Self Care | Attending: Cardiology

## 2019-11-05 DIAGNOSIS — Z539 Procedure and treatment not carried out, unspecified reason: Secondary | ICD-10-CM | POA: Insufficient documentation

## 2019-11-05 DIAGNOSIS — I4892 Unspecified atrial flutter: Secondary | ICD-10-CM | POA: Diagnosis not present

## 2019-11-05 DIAGNOSIS — I4891 Unspecified atrial fibrillation: Secondary | ICD-10-CM | POA: Diagnosis not present

## 2019-11-05 DIAGNOSIS — I447 Left bundle-branch block, unspecified: Secondary | ICD-10-CM | POA: Insufficient documentation

## 2019-11-05 SURGERY — CANCELLED PROCEDURE

## 2019-11-05 MED ORDER — INDOMETHACIN 50 MG RE SUPP
RECTAL | Status: AC
  Filled 2019-11-05: qty 2

## 2019-11-05 MED ORDER — SODIUM CHLORIDE 0.9 % IV SOLN: INTRAVENOUS | Status: DC

## 2019-11-05 NOTE — OR Nursing (Signed)
Patient arrived late, at 10a for 1030 CV but had coffee with creamer at 9a, so procedure cancelled.

## 2019-11-05 NOTE — Anesthesia Preprocedure Evaluation (Deleted)
Anesthesia Evaluation    Reviewed: Allergy & Precautions, Patient's Chart, lab work & pertinent test results  Airway        Dental   Pulmonary former smoker,           Cardiovascular hypertension,   08/06/19 Echo 1. Left ventricular ejection fraction, by estimation, is 25 to 30%. The  left ventricle has severely decreased function. The left ventricle  demonstrates global hypokinesis. Left ventricular diastolic function could  not be evaluated. Left ventricular  diastolic function could not be evaluated.  2. Right ventricular systolic function is mildly reduced. The right  ventricular size is mildly enlarged. There is mildly elevated pulmonary  artery systolic pressure.    Neuro/Psych negative neurological ROS  negative psych ROS   GI/Hepatic negative GI ROS, Neg liver ROS,   Endo/Other  negative endocrine ROS  Renal/GU negative Renal ROS     Musculoskeletal   Abdominal   Peds  Hematology   Anesthesia Other Findings   Reproductive/Obstetrics                            Anesthesia Physical Anesthesia Plan  ASA: IV  Anesthesia Plan: General   Post-op Pain Management:    Induction: Intravenous  PONV Risk Score and Plan: Treatment may vary due to age or medical condition  Airway Management Planned: Nasal Cannula and Natural Airway  Additional Equipment: None  Intra-op Plan:   Post-operative Plan:   Informed Consent:     Dental advisory given  Plan Discussed with:   Anesthesia Plan Comments:         Anesthesia Quick Evaluation

## 2019-11-08 ENCOUNTER — Encounter (HOSPITAL_COMMUNITY): Payer: Self-pay | Admitting: Cardiovascular Disease

## 2019-11-08 ENCOUNTER — Encounter (HOSPITAL_COMMUNITY)
Admission: AD | Disposition: A | Discharge status: Home / Self Care | Payer: Self-pay | Source: Ambulatory Visit | Attending: Cardiovascular Disease

## 2019-11-08 ENCOUNTER — Ambulatory Visit (HOSPITAL_COMMUNITY)
Admission: AD | Admit: 2019-11-08 | Discharge: 2019-11-08 | Disposition: A | Discharge status: Home / Self Care | Payer: Medicare PPO | Source: Ambulatory Visit | Attending: Cardiovascular Disease | Admitting: Cardiovascular Disease

## 2019-11-08 ENCOUNTER — Other Ambulatory Visit: Payer: Self-pay

## 2019-11-08 ENCOUNTER — Ambulatory Visit (HOSPITAL_COMMUNITY): Payer: Medicare PPO | Admitting: Certified Registered"

## 2019-11-08 DIAGNOSIS — I4819 Other persistent atrial fibrillation: Secondary | ICD-10-CM | POA: Diagnosis not present

## 2019-11-08 DIAGNOSIS — Z79899 Other long term (current) drug therapy: Secondary | ICD-10-CM | POA: Diagnosis not present

## 2019-11-08 DIAGNOSIS — I10 Essential (primary) hypertension: Secondary | ICD-10-CM | POA: Insufficient documentation

## 2019-11-08 DIAGNOSIS — I429 Cardiomyopathy, unspecified: Secondary | ICD-10-CM | POA: Insufficient documentation

## 2019-11-08 DIAGNOSIS — R0683 Snoring: Secondary | ICD-10-CM | POA: Diagnosis not present

## 2019-11-08 DIAGNOSIS — I447 Left bundle-branch block, unspecified: Secondary | ICD-10-CM | POA: Diagnosis not present

## 2019-11-08 DIAGNOSIS — E785 Hyperlipidemia, unspecified: Secondary | ICD-10-CM | POA: Diagnosis not present

## 2019-11-08 DIAGNOSIS — Z87891 Personal history of nicotine dependence: Secondary | ICD-10-CM | POA: Insufficient documentation

## 2019-11-08 DIAGNOSIS — I4892 Unspecified atrial flutter: Secondary | ICD-10-CM | POA: Diagnosis not present

## 2019-11-08 DIAGNOSIS — Z7901 Long term (current) use of anticoagulants: Secondary | ICD-10-CM | POA: Diagnosis not present

## 2019-11-08 HISTORY — PX: CARDIOVERSION: SHX1299

## 2019-11-08 LAB — POCT I-STAT, CHEM 8
BUN: 24 mg/dL — ABNORMAL HIGH (ref 8–23)
Calcium, Ion: 1.14 mmol/L — ABNORMAL LOW (ref 1.15–1.40)
Chloride: 105 mmol/L (ref 98–111)
Creatinine, Ser: 1.3 mg/dL — ABNORMAL HIGH (ref 0.61–1.24)
Glucose, Bld: 112 mg/dL — ABNORMAL HIGH (ref 70–99)
HCT: 43 % (ref 39.0–52.0)
Hemoglobin: 14.6 g/dL (ref 13.0–17.0)
Potassium: 4.4 mmol/L (ref 3.5–5.1)
Sodium: 141 mmol/L (ref 135–145)
TCO2: 24 mmol/L (ref 22–32)

## 2019-11-08 SURGERY — CARDIOVERSION
Anesthesia: General

## 2019-11-08 MED ORDER — SODIUM CHLORIDE 0.9 % IV SOLN: INTRAVENOUS | Status: DC | PRN

## 2019-11-08 MED ORDER — PROPOFOL 10 MG/ML IV BOLUS
INTRAVENOUS | Status: DC | PRN
  Administered 2019-11-08: 50 mg via INTRAVENOUS

## 2019-11-08 MED ORDER — SODIUM CHLORIDE 0.9 % IV SOLN
INTRAVENOUS | Status: AC | PRN
  Administered 2019-11-08: 500 mL via INTRAVENOUS

## 2019-11-08 MED ORDER — LIDOCAINE 2% (20 MG/ML) 5 ML SYRINGE
INTRAMUSCULAR | Status: DC | PRN
  Administered 2019-11-08: 40 mg via INTRAVENOUS

## 2019-11-08 NOTE — Anesthesia Preprocedure Evaluation (Addendum)
Anesthesia Evaluation  Patient identified by MRN, date of birth, ID band Patient awake    Reviewed: Allergy & Precautions, NPO status , Patient's Chart, lab work & pertinent test results  Airway Mallampati: I  TM Distance: >3 FB Neck ROM: Full    Dental no notable dental hx. (+) Teeth Intact, Dental Advisory Given   Pulmonary neg pulmonary ROS, former smoker,    Pulmonary exam normal breath sounds clear to auscultation       Cardiovascular hypertension, Normal cardiovascular exam+ dysrhythmias (on amiodarone and eliquis) Atrial Fibrillation  Rhythm:Irregular Rate:Normal  HLD EKG known LBBB  TTE 2021 EF 25-30%, mildly reduced RV function, mild to mod MR, mild to mod TR  Stress Test 2016 Overall Impression:  Low risk stress nuclear study With a moderate fixed perfusion defect in the inferoseptal wall that is most consistent with LBBB septal wall motion, but cannot exclude distal LAD (or rPDA) infarction.   Neuro/Psych negative neurological ROS  negative psych ROS   GI/Hepatic negative GI ROS, Neg liver ROS,   Endo/Other  negative endocrine ROS  Renal/GU negative Renal ROS  negative genitourinary   Musculoskeletal  (+) Arthritis ,   Abdominal   Peds  Hematology negative hematology ROS (+)   Anesthesia Other Findings   Reproductive/Obstetrics                           Anesthesia Physical Anesthesia Plan  ASA: III  Anesthesia Plan: General   Post-op Pain Management:    Induction: Intravenous  PONV Risk Score and Plan: Propofol infusion and Treatment may vary due to age or medical condition  Airway Management Planned: Natural Airway  Additional Equipment:   Intra-op Plan:   Post-operative Plan:   Informed Consent: I have reviewed the patients History and Physical, chart, labs and discussed the procedure including the risks, benefits and alternatives for the proposed anesthesia  with the patient or authorized representative who has indicated his/her understanding and acceptance.     Dental advisory given  Plan Discussed with: CRNA  Anesthesia Plan Comments:         Anesthesia Quick Evaluation

## 2019-11-08 NOTE — Transfer of Care (Signed)
Immediate Anesthesia Transfer of Care Note  Patient: Daniel Cain  Procedure(s) Performed: CARDIOVERSION (N/A )  Patient Location: Endoscopy Unit  Anesthesia Type:General  Level of Consciousness: awake and patient cooperative  Airway & Oxygen Therapy: Patient Spontanous Breathing  Post-op Assessment: Report given to RN and Post -op Vital signs reviewed and stable  Post vital signs: Reviewed and stable  Last Vitals:  Vitals Value Taken Time  BP    Temp    Pulse    Resp    SpO2      Last Pain:  Vitals:   11/08/19 0805  TempSrc: Temporal  PainSc: 0-No pain         Complications: No complications documented.

## 2019-11-08 NOTE — Anesthesia Procedure Notes (Signed)
Procedure Name: General with mask airway Date/Time: 11/08/2019 9:25 AM Performed by: Julian Reil, CRNA Pre-anesthesia Checklist: Patient identified, Emergency Drugs available, Suction available, Patient being monitored and Timeout performed Oxygen Delivery Method: Ambu bag Preoxygenation: Pre-oxygenation with 100% oxygen Induction Type: IV induction Placement Confirmation: positive ETCO2

## 2019-11-08 NOTE — Interval H&P Note (Signed)
History and Physical Interval Note:  11/08/2019 7:54 AM  Daniel Cain  has presented today for surgery, with the diagnosis of atrial fib.  The various methods of treatment have been discussed with the patient and family. After consideration of risks, benefits and other options for treatment, the patient has consented to  Procedure(s): CARDIOVERSION (N/A) as a surgical intervention.  The patient's history has been reviewed, patient examined, no change in status, stable for surgery.  I have reviewed the patient's chart and labs.  Questions were answered to the patient's satisfaction.     DCCV for atrial flutter. On eliquis. No missed doses in >3 weeks. NPO.   Gerri Spore T. Flora Lipps, MD [facility]  [address] [place], [place] [address] [phone]  7:54 AM

## 2019-11-08 NOTE — Anesthesia Postprocedure Evaluation (Signed)
Anesthesia Post Note  Patient: Daniel Cain  Procedure(s) Performed: CARDIOVERSION (N/A )     Patient location during evaluation: Endoscopy Anesthesia Type: General Level of consciousness: awake and alert Pain management: pain level controlled Vital Signs Assessment: post-procedure vital signs reviewed and stable Respiratory status: spontaneous breathing, nonlabored ventilation, respiratory function stable and patient connected to nasal cannula oxygen Cardiovascular status: blood pressure returned to baseline and stable Postop Assessment: no apparent nausea or vomiting Anesthetic complications: no   No complications documented.  Last Vitals:  Vitals:   11/08/19 0944 11/08/19 0946  BP: (!) 176/128 116/81  Pulse: (!) 50 (!) 53  Resp: 14 16  Temp:    SpO2: 98% 97%    Last Pain:  Vitals:   11/08/19 0946  TempSrc:   PainSc: 0-No pain                 Tam Savoia L Halana Deisher

## 2019-11-08 NOTE — Discharge Instructions (Signed)
Electrical Cardioversion Electrical cardioversion is the delivery of a jolt of electricity to restore a normal rhythm to the heart. A rhythm that is too fast or is not regular keeps the heart from pumping well. In this procedure, sticky patches or metal paddles are placed on the chest to deliver electricity to the heart from a device. This procedure may be done in an emergency if:  There is low or no blood pressure as a result of the heart rhythm.  Normal rhythm must be restored as fast as possible to protect the brain and heart from further damage.  It may save a life. This may also be a scheduled procedure for irregular or fast heart rhythms that are not immediately life-threatening. Tell a health care provider about:  Any allergies you have.  All medicines you are taking, including vitamins, herbs, eye drops, creams, and over-the-counter medicines.  Any problems you or family members have had with anesthetic medicines.  Any blood disorders you have.  Any surgeries you have had.  Any medical conditions you have.  Whether you are pregnant or may be pregnant. What are the risks? Generally, this is a safe procedure. However, problems may occur, including:  Allergic reactions to medicines.  A blood clot that breaks free and travels to other parts of your body.  The possible return of an abnormal heart rhythm within hours or days after the procedure.  Your heart stopping (cardiac arrest). This is rare. What happens before the procedure? Medicines  Your health care provider may have you start taking: ? Blood-thinning medicines (anticoagulants) so your blood does not clot as easily. ? Medicines to help stabilize your heart rate and rhythm.  Ask your health care provider about: ? Changing or stopping your regular medicines. This is especially important if you are taking diabetes medicines or blood thinners. ? Taking medicines such as aspirin and ibuprofen. These medicines can  thin your blood. Do not take these medicines unless your health care provider tells you to take them. ? Taking over-the-counter medicines, vitamins, herbs, and supplements. General instructions  Follow instructions from your health care provider about eating or drinking restrictions.  Plan to have someone take you home from the hospital or clinic.  If you will be going home right after the procedure, plan to have someone with you for 24 hours.  Ask your health care provider what steps will be taken to help prevent infection. These may include washing your skin with a germ-killing soap. What happens during the procedure?   An IV will be inserted into one of your veins.  Sticky patches (electrodes) or metal paddles may be placed on your chest.  You will be given a medicine to help you relax (sedative).  An electrical shock will be delivered. The procedure may vary among health care providers and hospitals. What can I expect after the procedure?  Your blood pressure, heart rate, breathing rate, and blood oxygen level will be monitored until you leave the hospital or clinic.  Your heart rhythm will be watched to make sure it does not change.  You may have some redness on the skin where the shocks were given. Follow these instructions at home:  Do not drive for 24 hours if you were given a sedative during your procedure.  Take over-the-counter and prescription medicines only as told by your health care provider.  Ask your health care provider how to check your pulse. Check it often.  Rest for 48 hours after the procedure or   as told by your health care provider.  Avoid or limit your caffeine use as told by your health care provider.  Keep all follow-up visits as told by your health care provider. This is important. Contact a health care provider if:  You feel like your heart is beating too quickly or your pulse is not regular.  You have a serious muscle cramp that does not go  away. Get help right away if:  You have discomfort in your chest.  You are dizzy or you feel faint.  You have trouble breathing or you are short of breath.  Your speech is slurred.  You have trouble moving an arm or leg on one side of your body.  Your fingers or toes turn cold or blue. Summary  Electrical cardioversion is the delivery of a jolt of electricity to restore a normal rhythm to the heart.  This procedure may be done right away in an emergency or may be a scheduled procedure if the condition is not an emergency.  Generally, this is a safe procedure.  After the procedure, check your pulse often as told by your health care provider. This information is not intended to replace advice given to you by your health care provider. Make sure you discuss any questions you have with your health care provider. Document Revised: 12/14/2018 Document Reviewed: 12/14/2018 Elsevier Patient Education  2020 Elsevier Inc.  

## 2019-11-08 NOTE — CV Procedure (Signed)
° °  DIRECT CURRENT CARDIOVERSION  NAME:  Daniel Cain    MRN: [id number] DOB:  [birth year]    ADMIT DATE: 11/08/2019  Indication:  Symptomatic atrial flutter  Procedure Note:  The patient signed informed consent.  They have had had therapeutic anticoagulation with eliquis greater than 3 weeks.  Anesthesia was administered by Dr. Armond Hang.  Adequate airway was maintained throughout and vital followed per protocol.  They were cardioverted x 1 with 120J of biphasic synchronized energy.  They converted to NSR.  There were no apparent complications.  The patient had normal neuro status and respiratory status post procedure with vitals stable as recorded elsewhere.    Follow up: They will continue on current medical therapy and follow up with cardiology as scheduled.  Gerri Spore T. Flora Lipps, MD [facility]  [address] [place], [place] [address] [phone]  9:27 AM

## 2019-11-10 ENCOUNTER — Encounter (HOSPITAL_COMMUNITY): Payer: Self-pay | Admitting: Cardiovascular Disease

## 2019-11-10 ENCOUNTER — Telehealth: Payer: Self-pay | Admitting: *Deleted

## 2019-11-10 NOTE — Telephone Encounter (Signed)
CPAP titration PA submitted to [employer] via web portal. 

## 2019-11-12 ENCOUNTER — Telehealth: Payer: Self-pay | Admitting: *Deleted

## 2019-11-12 NOTE — Telephone Encounter (Signed)
-----   Message from Reesa Chew, CMA sent at 11/01/2019  4:40 PM EDT ----- Regarding: precert CPAP titration

## 2019-11-15 ENCOUNTER — Other Ambulatory Visit (HOSPITAL_COMMUNITY): Payer: Medicare PPO

## 2019-11-19 ENCOUNTER — Other Ambulatory Visit: Payer: Self-pay | Admitting: Cardiovascular Disease

## 2019-11-19 DIAGNOSIS — G4733 Obstructive sleep apnea (adult) (pediatric): Secondary | ICD-10-CM

## 2019-11-24 DIAGNOSIS — H40013 Open angle with borderline findings, low risk, bilateral: Secondary | ICD-10-CM | POA: Diagnosis not present

## 2019-12-23 ENCOUNTER — Encounter (HOSPITAL_BASED_OUTPATIENT_CLINIC_OR_DEPARTMENT_OTHER): Payer: Medicare PPO | Admitting: Cardiovascular Disease

## 2019-12-28 DIAGNOSIS — H6123 Impacted cerumen, bilateral: Secondary | ICD-10-CM | POA: Diagnosis not present

## 2019-12-28 DIAGNOSIS — H9209 Otalgia, unspecified ear: Secondary | ICD-10-CM | POA: Diagnosis not present

## 2020-01-10 DIAGNOSIS — Z20828 Contact with and (suspected) exposure to other viral communicable diseases: Secondary | ICD-10-CM | POA: Diagnosis not present

## 2020-01-14 ENCOUNTER — Encounter (HOSPITAL_BASED_OUTPATIENT_CLINIC_OR_DEPARTMENT_OTHER): Payer: Medicare PPO | Admitting: Cardiovascular Disease

## 2020-01-24 NOTE — Progress Notes (Signed)
HPI: FU atrial fibrillation.Cardiac catheterization April 2003 showed normal coronary arteries and ejection fraction 45 to 50%. Nuclearstudy April 2016 showed ejection fraction 66%, artifact suggestive of left bundle branch block but no ischemia. Patientpreviously seen by Dr. Clarene Duke with complaints of increased dyspnea on exertion. Electrocardiogram showed atrial fibrillation.Echocardiogram March 2021 showed ejection fraction 25 to 30%, mild RV dysfunction and mild right ventricular enlargement, severe biatrial enlargement,mild to moderate mitral regurgitation and tricuspid regurgitation and mildly dilated aortic root. Abdominal ultrasound March 2021 showed no aneurysm. Patient had successful cardioversion on September 01, 2019.   At time of follow-up he was back in atrial fibrillation.  We initiated amiodarone and he underwent cardioversion successfully June 2021.  Since last seenthere is no dyspnea, chest pain or syncope.  He states his heart rate is running in the 40s.  Current Outpatient Medications  Medication Sig Dispense Refill   amiodarone (PACERONE) 200 MG tablet Take 1 tablet (200 mg total) by mouth daily. 90 tablet 3   atorvastatin (LIPITOR) 80 MG tablet Take 80 mg by mouth daily.      carvedilol (COREG) 25 MG tablet Take 25 mg by mouth 2 (two) times daily.      ELIQUIS 5 MG TABS tablet Take 5 mg by mouth 2 (two) times daily.      oxcarbazepine (TRILEPTAL) 600 MG tablet Take 1 tablet (600 mg total) by mouth in the morning AND 1.5 tablets (900 mg total) at bedtime. 225 tablet 3   sacubitril-valsartan (ENTRESTO) 24-26 MG Take 1 tablet by mouth 2 (two) times daily. 60 tablet 6   sildenafil (VIAGRA) 100 MG tablet Take 100 mg by mouth daily as needed for erectile dysfunction.     No current facility-administered medications for this visit.     Past Medical History:  Diagnosis Date   Atrial fibrillation (HCC)    Hyperlipidemia    Hypertension    Left bundle  branch block    Nocturia    Osteoarthritis    Vertigo     Past Surgical History:  Procedure Laterality Date   CARDIOVERSION N/A 09/01/2019   Procedure: CARDIOVERSION;  Surgeon: Little Ishikawa, MD;  Location: [facility] ENDOSCOPY;  Service: Cardiovascular;  Laterality: N/A;   CARDIOVERSION N/A 11/08/2019   Procedure: CARDIOVERSION;  Surgeon: Sande Rives, MD;  Location: [facility] ENDOSCOPY;  Service: Cardiovascular;  Laterality: N/A;   HERNIA REPAIR     TOTAL HIP ARTHROPLASTY Bilateral     Social History   Socioeconomic History   Marital status: Married    Spouse name: Not on file   Number of children: 3   Years of education: Not on file   Highest education level: Not on file  Occupational History   Not on file  Tobacco Use   Smoking status: Former Smoker   Smokeless tobacco: Never Used  Substance and Sexual Activity   Alcohol use: Yes    Alcohol/week: 1.0 standard drink    Types: 1 Glasses of wine per week    Comment: 1-2 glasses wine per day   Drug use: No   Sexual activity: Never    Birth control/protection: Abstinence  Other Topics Concern   Not on file  Social History Narrative   Not on file   Social Determinants of Health   Financial Resource Strain:    Difficulty of Paying Living Expenses: Not on file  Food Insecurity:    Worried About Running Out of Food in the Last Year: Not on file  Ran Out of Food in the Last Year: Not on file  Transportation Needs:    Lack of Transportation (Medical): Not on file   Lack of Transportation (Non-Medical): Not on file  Physical Activity:    Days of Exercise per Week: Not on file   Minutes of Exercise per Session: Not on file  Stress:    Feeling of Stress : Not on file  Social Connections:    Frequency of Communication with Friends and Family: Not on file   Frequency of Social Gatherings with Friends and Family: Not on file   Attends Religious Services: Not on file   Active Member of  Clubs or Organizations: Not on file   Attends Banker Meetings: Not on file   Marital Status: Not on file  Intimate Partner Violence:    Fear of Current or Ex-Partner: Not on file   Emotionally Abused: Not on file   Physically Abused: Not on file   Sexually Abused: Not on file    Family History  Problem Relation Age of Onset   COPD Mother    Stroke Mother    Hypertension Father     ROS: no fevers or chills, productive cough, hemoptysis, dysphasia, odynophagia, melena, hematochezia, dysuria, hematuria, rash, seizure activity, orthopnea, PND, pedal edema, claudication. Remaining systems are negative.  Physical Exam: Well-developed well-nourished in no acute distress.  Skin is warm and dry.  HEENT is normal.  Neck is supple.  Chest is clear to auscultation with normal expansion.  Cardiovascular exam is regular rate and rhythm.  Abdominal exam nontender or distended. No masses palpated. Extremities show no edema. neuro grossly intact  ECG-Marked sinus bradycardia, left bundle branch block.  Personally reviewed  A/P  1 paroxysmal atrial fibrillation-patient remains in sinus rhythm status post cardioversion.  Continue amiodarone.  Continue apixaban.  Check hemoglobin and renal function.  Check TSH, liver functions and chest x-ray.  2 hypertension-blood pressure elevated.  He is somewhat bradycardic.  Decrease carvedilol to 12.5 mg twice daily.  Increase Entresto to 49/51 twice daily.  Check potassium and renal function in 1 week.  Follow blood pressure and increase medications as needed.  3 hyperlipidemia-continue statin.  Check lipids and liver.  4 cardiomyopathy-this was previously felt secondary to tachycardia.  We will continue Entresto and carvedilol.  Plan repeat echocardiogram in 2 months to see if LV function has normalized.  If not would need ischemia evaluation.  5 sleep apnea-Per Dr. Tresa Endo.  Olga Millers, MD

## 2020-01-27 ENCOUNTER — Encounter: Payer: Self-pay | Admitting: Cardiology

## 2020-01-27 ENCOUNTER — Ambulatory Visit (INDEPENDENT_AMBULATORY_CARE_PROVIDER_SITE_OTHER): Payer: Medicare PPO | Admitting: Cardiology

## 2020-01-27 ENCOUNTER — Other Ambulatory Visit: Payer: Self-pay

## 2020-01-27 VITALS — BP 160/96 | HR 45 | Temp 97.8°F | Ht 72.0 in | Wt 197.8 lb

## 2020-01-27 DIAGNOSIS — E7800 Pure hypercholesterolemia, unspecified: Secondary | ICD-10-CM

## 2020-01-27 DIAGNOSIS — I480 Paroxysmal atrial fibrillation: Secondary | ICD-10-CM | POA: Diagnosis not present

## 2020-01-27 DIAGNOSIS — I420 Dilated cardiomyopathy: Secondary | ICD-10-CM | POA: Diagnosis not present

## 2020-01-27 DIAGNOSIS — I10 Essential (primary) hypertension: Secondary | ICD-10-CM

## 2020-01-27 MED ORDER — CARVEDILOL 12.5 MG PO TABS: 12.5000 mg | ORAL_TABLET | Freq: Two times a day (BID) | ORAL | Status: DC

## 2020-01-27 MED ORDER — ENTRESTO 49-51 MG PO TABS: 1.0000 | ORAL_TABLET | Freq: Two times a day (BID) | ORAL | 3 refills | Status: DC

## 2020-01-27 NOTE — Patient Instructions (Signed)
Medication Instructions:   DECREASE CARVEDILOL TO 12.5 MG TWICE DAILY= 1/2 OF THE 25 MG TABLET TWICE DAILY  INCREASE ENTRESTO TO 49/51 MG TWICE DAILY=2 OF THE 24/26 MG TABLETS TWICE DAILY  *If you need a refill on your cardiac medications before your next appointment, please call your pharmacy*   Lab Work:  Your physician recommends that you return for lab work in: ONE WEEK FASTING  If you have labs (blood work) drawn today and your tests are completely normal, you will receive your results only by:  MyChart Message (if you have MyChart) OR  A paper copy in the mail If you have any lab test that is abnormal or we need to change your treatment, we will call you to review the results.   Testing/Procedures:  Your physician has requested that you have an echocardiogram. Echocardiography is a painless test that uses sound waves to create images of your heart. It provides your doctor with information about the size and shape of your heart and how well your hearts chambers and valves are working. This procedure takes approximately one hour. There are no restrictions for this procedure.[address]=SCHEDULE IN 2 MONTHS  A chest x-ray takes a picture of the organs and structures inside the chest, including the heart, lungs, and blood vessels. This test can show several things, including, whether the heart is enlarges; whether fluid is building up in the lungs; and whether pacemaker / defibrillator leads are still in place. [address]= [facility]     Follow-Up: At [facility], you and your health needs are our priority.  As part of our continuing mission to provide you with exceptional heart care, we have created designated Provider Care Teams.  These Care Teams include your primary Cardiologist (physician) and Advanced Practice Providers (APPs -  Physician Assistants and Nurse Practitioners) who all work together to provide you with the care you need, when you  need it.  We recommend signing up for the patient portal called "MyChart".  Sign up information is provided on this After Visit Summary.  MyChart is used to connect with patients for Virtual Visits (Telemedicine).  Patients are able to view lab/test results, encounter notes, upcoming appointments, etc.  Non-urgent messages can be sent to your provider as well.   To learn more about what you can do with MyChart, go to [email or web address].    Your next appointment:   6 month(s)  The format for your next appointment:   In Person  Provider:   You may see Olga Millers MD or one of the following Advanced Practice Providers on your designated Care Team:    Corine Shelter, PA-C  [place], [place]  Edd Fabian, [place]

## 2020-02-03 ENCOUNTER — Ambulatory Visit: Payer: Medicare PPO | Admitting: Adult Health

## 2020-02-03 ENCOUNTER — Encounter: Payer: Self-pay | Admitting: Adult Health

## 2020-02-03 VITALS — BP 122/78 | HR 65 | Ht 72.0 in | Wt 196.2 lb

## 2020-02-03 DIAGNOSIS — R42 Dizziness and giddiness: Secondary | ICD-10-CM

## 2020-02-03 DIAGNOSIS — Z5181 Encounter for therapeutic drug level monitoring: Secondary | ICD-10-CM | POA: Diagnosis not present

## 2020-02-03 DIAGNOSIS — R569 Unspecified convulsions: Secondary | ICD-10-CM | POA: Diagnosis not present

## 2020-02-03 DIAGNOSIS — H8190 Unspecified disorder of vestibular function, unspecified ear: Secondary | ICD-10-CM

## 2020-02-03 NOTE — Patient Instructions (Signed)
Your Plan:  Contact [facility] to reschedule titration study Speak to them in regards to pursing titration study vs starting autoPAP   Continue Trileptal 600mg  AM and 900mg  PM We will check your levels today    Follow up in 6 months or call earlier if needed     Thank you for coming to see at [facility]. I hope we have been able to provide you high quality care today.  You may receive a patient satisfaction survey over the next few weeks. We would appreciate your feedback and comments so that we may continue to improve ourselves and the health of our patients.

## 2020-02-03 NOTE — Progress Notes (Signed)
I agree with the above plan 

## 2020-02-03 NOTE — Progress Notes (Signed)
NEUROLOGY CLINIC FOLLOW UP PATIENT NOTE  NAME: Daniel Cain DOB: Jun 28, 1940    Chief complaint: Chief Complaint  Patient presents with  . Follow-up    4 mo f/u - doing well  . room 5    alone     HPI:  Today, 02/03/2020, Daniel Cain returns for follow-up visit unaccompanied  Episodes of lightheadedness, vertigo, imbalance and nausea/vomiting previously discussed Per cardiology, his episodes were not felt to be related to atrial fibrillation therefore Trileptal dosage adjusted to 600 mg a.m. and 900 mg p.m.. He has been doing well since increasing dosage tolerating well without recurrent episodes.   He underwent successful cardioversion on 11/08/2019 which improved SOB symptoms Blood pressure today 122/78  Diagnosis of sleep apnea with scheduled titration study but unfortunately found to be positive for Covid therefore titration study canceled.  He has not been called to reschedule testing.  No further concerns at this time    Update 10/05/2019 JM: Daniel Cain returns by patient request accompanied by his wife due to recent onset of lightheadedness, vertigo and nausea. Prior visit 01/28/2019 stable at that time. Routine follow-up being seen for prior similar episodes initially evaluated by Dr. Roda Shutters with unclear etiology and Ddx vestibular paroxysmia vs basilar type migraine vs mid pontine small capillary telangectasia related (as demonstrated on MRI). Prior work-up negative for BPPV, stroke or intracranial stenosis. Evaluation by ENT consider questionable Mnire's disease. Trileptal initiated in 04/2015 with only one recurrent episode in 06/2015 and has been stable on 600 mg twice daily dosing without recurrent episodes until recently. He had recurrent symptoms of initially feeling lightheaded and then progressing to room spinning, imbalance and difficulty standing eventually accompanied by nausea and vomiting. Symptoms typically last 4 to 6 hours and are debilitating where he will  typically lay down and go to sleep. Resolution of symptoms by the time he wakes up. He also experienced an additional episode on 10/01/2019. He endorses ongoing compliance with Trileptal 600 mg twice daily. He was recently diagnosed with atrial fibrillation in 06/2019 with initiation of apixaban and undergoing cardioversion on 09/01/2019 initially converted but unfortunately shortly returned back into atrial fibrillation. Atrial fibrillation found after visit with PCP, Dr. Clarene Duke, for complaints of dyspnea on exertion. During recurrent episodes, he denies dyspnea, chest pains, migraine or headache, palpitations or syncope.       ROS: Review of Systems  Constitutional: Negative.   HENT: Negative.   Eyes: Negative.   Respiratory: Negative.   Cardiovascular: Negative.   Genitourinary: Negative.   Musculoskeletal: Negative.   Skin: Negative.   Endo/Heme/Allergies: Negative.   Psychiatric/Behavioral: Negative.        Past Medical History:  Diagnosis Date  . Atrial fibrillation (HCC)   . Hyperlipidemia   . Hypertension   . Left bundle branch block   . Nocturia   . Osteoarthritis   . Vertigo    Past Surgical History:  Procedure Laterality Date  . CARDIOVERSION N/A 09/01/2019   Procedure: CARDIOVERSION;  Surgeon: Little Ishikawa, MD;  Location: [facility] ENDOSCOPY;  Service: Cardiovascular;  Laterality: N/A;  . CARDIOVERSION N/A 11/08/2019   Procedure: CARDIOVERSION;  Surgeon: Sande Rives, MD;  Location: [facility] ENDOSCOPY;  Service: Cardiovascular;  Laterality: N/A;  . HERNIA REPAIR    . TOTAL HIP ARTHROPLASTY Bilateral    Family History  Problem Relation Age of Onset  . COPD Mother   . Stroke Mother   . Hypertension Father    Current Outpatient Medications  Medication Sig Dispense  Refill  . amiodarone (PACERONE) 200 MG tablet Take 1 tablet (200 mg total) by mouth daily. 90 tablet 3  . atorvastatin (LIPITOR) 80 MG tablet Take 80 mg by mouth daily.     . carvedilol (COREG)  12.5 MG tablet Take 1 tablet (12.5 mg total) by mouth 2 (two) times daily.    Marland Kitchen ELIQUIS 5 MG TABS tablet Take 5 mg by mouth 2 (two) times daily.     Marland Kitchen oxcarbazepine (TRILEPTAL) 600 MG tablet Take 1 tablet (600 mg total) by mouth in the morning AND 1.5 tablets (900 mg total) at bedtime. 225 tablet 3  . sacubitril-valsartan (ENTRESTO) 49-51 MG Take 1 tablet by mouth 2 (two) times daily. 180 tablet 3  . sildenafil (VIAGRA) 100 MG tablet Take 100 mg by mouth daily as needed for erectile dysfunction.     No current facility-administered medications for this visit.   No Known Allergies Social History   Socioeconomic History  . Marital status: Married    Spouse name: Not on file  . Number of children: 3  . Years of education: Not on file  . Highest education level: Not on file  Occupational History  . Not on file  Tobacco Use  . Smoking status: Former Games developer  . Smokeless tobacco: Never Used  Substance and Sexual Activity  . Alcohol use: Yes    Alcohol/week: 1.0 standard drink    Types: 1 Glasses of wine per week    Comment: 1-2 glasses wine per day  . Drug use: No  . Sexual activity: Never    Birth control/protection: Abstinence  Other Topics Concern  . Not on file  Social History Narrative  . Not on file   Social Determinants of Health   Financial Resource Strain:   . Difficulty of Paying Living Expenses: Not on file  Food Insecurity:   . Worried About Programme researcher, broadcasting/film/video in the Last Year: Not on file  . Ran Out of Food in the Last Year: Not on file  Transportation Needs:   . Lack of Transportation (Medical): Not on file  . Lack of Transportation (Non-Medical): Not on file  Physical Activity:   . Days of Exercise per Week: Not on file  . Minutes of Exercise per Session: Not on file  Stress:   . Feeling of Stress : Not on file  Social Connections:   . Frequency of Communication with Friends and Family: Not on file  . Frequency of Social Gatherings with Friends and Family:  Not on file  . Attends Religious Services: Not on file  . Active Member of Clubs or Organizations: Not on file  . Attends Banker Meetings: Not on file  . Marital Status: Not on file  Intimate Partner Violence:   . Fear of Current or Ex-Partner: Not on file  . Emotionally Abused: Not on file  . Physically Abused: Not on file  . Sexually Abused: Not on file     Physical Exam  Today's Vitals   02/03/20 1005  BP: 122/78  Pulse: 65  Weight: 196 lb 3.2 oz (89 kg)  Height: 6' (1.829 m)   Body mass index is 26.61 kg/m.   General - Well nourished, well developed, very pleasant elderly Caucasian male, in no apparent distress. Cardiovascular -regular rate and rhythm  Mental Status -  Level of arousal and orientation to time, place, and person were intact. Language including expression, naming, repetition, comprehension, reading, and writing was assessed and found intact. Attention  span and concentration were normal. Recent and remote memory were intact. Fund of Knowledge was assessed and was intact.  Cranial Nerves II - XII - II - Visual field intact OU. III, IV, VI - Extraocular movements intact. V - Facial sensation intact bilaterally. VII - Facial movement intact bilaterally. VIII - Hearing & vestibular intact bilaterally. X - Palate elevates symmetrically. XI - Chin turning & shoulder shrug intact bilaterally. XII - Tongue protrusion intact. Motor Strength - The patient's strength was normal in all extremities and pronator drift was absent.  Bulk was normal and fasciculations were absent.   Motor Tone - Muscle tone was assessed at the neck and appendages and was normal. Reflexes - The patient's reflexes were normal in all extremities and he had no pathological reflexes. Sensory - Light touch, temperature/pinprick, vibration and proprioception, and Romberg testing were assessed and were normal.   Coordination - The patient had normal movements in the hands and  feet with no ataxia or dysmetria.  Tremor was absent. Gait and Station - The patient's transfers, posture, gait, station, and turns were observed as normal.      Assessment:   Daniel Cain is a 79 year old male with PMH of HLD and HTN with recurrent episodes consisting of lightheadedness, dizziness, nausea and vomiting who was started on Trileptal low-dose in 04/2015. Additional episode 06/2015 with Trileptal dosage increased to 600 mg twice daily without recurrent episodes. Questionable Mnire's disease by ENT. Per Dr. Roda Shutters, Ddx includes vestibular paroxymia vs basilar migraine vs mid pontine small capillary telangectasia related. Recent diagnosis of atrial fibrillation in 06/2019 undergoing unsuccessful cardioversion 08/2019 with repeat successful cardioversion 10/2019.  Diagnosed with sleep apnea in 08/2019 plan on undergoing titration study through [facility] but unfortunately placed on hold due to COVID-19 positive.  Returned 09/2019 with 2 additional episodes since prior visit in 05/2019 and recently on 10/01/2019. Presentation similar to previous episodes.    Plan: -Episode subsided with Trileptal dosage increased -continue Trileptal 600/900 -Will obtain Trileptal level.  Recent CMP and CBC satisfactory -Advised to contact [facility] to reschedule titration study  -Continue Eliquis and ongoing follow-up with cardiology for atrial fibrillation - f/u with PCP for management of HTN and HLD  -Advised to continue to stay active and maintain a healthy diet   Follow-up in 6 months or call earlier if needed   I spent 20 minutes of face-to-face and non-face-to-face time with patient.  This included previsit chart review, lab review, study review, order entry, electronic health record documentation, patient education regarding recurrent episodes and resolution with increasing Trileptal dosage, recent successful cardioversion for atrial fibrillation and sleep apnea, and answered all  questions to patients satisfaction   Ihor Austin, AGNP-BC  [facility] [address] [place], [place] [address]  Phone [phone] Fax [phone] Note: This document was prepared with digital dictation and possible smart phrase technology. Any transcriptional errors that result from this process are unintentional.

## 2020-02-07 LAB — 10-HYDROXYCARBAZEPINE: Oxcarbazepine SerPl-Mcnc: 27 ug/mL (ref 10–35)

## 2020-02-10 ENCOUNTER — Ambulatory Visit (HOSPITAL_COMMUNITY): Payer: Medicare PPO | Attending: Cardiology

## 2020-02-10 ENCOUNTER — Other Ambulatory Visit: Payer: Self-pay

## 2020-02-10 DIAGNOSIS — I420 Dilated cardiomyopathy: Secondary | ICD-10-CM | POA: Diagnosis not present

## 2020-02-10 LAB — ECHOCARDIOGRAM COMPLETE
Area-P 1/2: 2.08 cm2
S' Lateral: 3.7 cm

## 2020-02-14 ENCOUNTER — Telehealth: Payer: Self-pay | Admitting: *Deleted

## 2020-02-14 DIAGNOSIS — I420 Dilated cardiomyopathy: Secondary | ICD-10-CM

## 2020-02-14 NOTE — Telephone Encounter (Addendum)
Left message for pt to call   ----- Message from [name], MD sent at 02/14/2020  7:04 AM EDT ----- LV function remains decreased; schedule cardiac CTA to RO CAD Olga Millers

## 2020-02-16 ENCOUNTER — Encounter: Payer: Self-pay | Admitting: *Deleted

## 2020-02-16 NOTE — Telephone Encounter (Signed)
Spoke with pt, Aware of dr Ludwig Clarks recommendations. Order placed and instruction letter mailed to the patient.

## 2020-02-21 DIAGNOSIS — I480 Paroxysmal atrial fibrillation: Secondary | ICD-10-CM | POA: Diagnosis not present

## 2020-02-21 DIAGNOSIS — E7800 Pure hypercholesterolemia, unspecified: Secondary | ICD-10-CM | POA: Diagnosis not present

## 2020-02-21 LAB — COMPREHENSIVE METABOLIC PANEL
ALT: 25 IU/L (ref 0–44)
AST: 21 IU/L (ref 0–40)
Albumin/Globulin Ratio: 2.1 (ref 1.2–2.2)
Albumin: 4.4 g/dL (ref 3.7–4.7)
Alkaline Phosphatase: 80 IU/L (ref 44–121)
BUN/Creatinine Ratio: 19 (ref 10–24)
BUN: 22 mg/dL (ref 8–27)
Bilirubin Total: 0.4 mg/dL (ref 0.0–1.2)
CO2: 26 mmol/L (ref 20–29)
Calcium: 9 mg/dL (ref 8.6–10.2)
Chloride: 102 mmol/L (ref 96–106)
Creatinine, Ser: 1.13 mg/dL (ref 0.76–1.27)
GFR calc Af Amer: 72 mL/min/{1.73_m2} (ref >59)
GFR calc non Af Amer: 62 mL/min/{1.73_m2} (ref >59)
Globulin, Total: 2.1 g/dL (ref 1.5–4.5)
Glucose: 95 mg/dL (ref 65–99)
Potassium: 5.2 mmol/L (ref 3.5–5.2)
Sodium: 138 mmol/L (ref 134–144)
Total Protein: 6.5 g/dL (ref 6.0–8.5)

## 2020-02-21 LAB — LIPID PANEL
Chol/HDL Ratio: 2.7 ratio (ref 0.0–5.0)
Cholesterol, Total: 147 mg/dL (ref 100–199)
HDL: 54 mg/dL (ref >39)
LDL Chol Calc (NIH): 80 mg/dL (ref 0–99)
Triglycerides: 62 mg/dL (ref 0–149)
VLDL Cholesterol Cal: 13 mg/dL (ref 5–40)

## 2020-02-21 LAB — CBC
Hematocrit: 42.5 % (ref 37.5–51.0)
Hemoglobin: 14 g/dL (ref 13.0–17.7)
MCH: 31.9 pg (ref 26.6–33.0)
MCHC: 32.9 g/dL (ref 31.5–35.7)
MCV: 97 fL (ref 79–97)
Platelets: 166 10*3/uL (ref 150–450)
RBC: 4.39 x10E6/uL (ref 4.14–5.80)
RDW: 12.1 % (ref 11.6–15.4)
WBC: 6.7 10*3/uL (ref 3.4–10.8)

## 2020-02-21 LAB — TSH: TSH: 2.31 u[IU]/mL (ref 0.450–4.500)

## 2020-02-29 ENCOUNTER — Ambulatory Visit: Payer: Medicare PPO | Attending: Internal Medicine

## 2020-02-29 DIAGNOSIS — Z23 Encounter for immunization: Secondary | ICD-10-CM

## 2020-02-29 NOTE — Progress Notes (Signed)
   Covid-19 Vaccination Clinic  Name:  MILEY LINDON    MRN: [id number] DOB: 1940/12/31  02/29/2020  Mr. Ahn was observed post Covid-19 immunization for 15 minutes without incident. He was provided with Vaccine Information Sheet and instruction to access the V-Safe system.   Mr. Fenlon was instructed to call 911 with any severe reactions post vaccine: Marland Kitchen Difficulty breathing  . Swelling of face and throat  . A fast heartbeat  . A bad rash all over body  . Dizziness and weakness

## 2020-03-03 ENCOUNTER — Telehealth (HOSPITAL_COMMUNITY): Payer: Self-pay | Admitting: Emergency Medicine

## 2020-03-03 NOTE — Telephone Encounter (Signed)
Reaching out to patient to offer assistance regarding upcoming cardiac imaging study; pt verbalizes understanding of appt date/time, parking situation and where to check in, pre-test NPO status and medications ordered, and verified current allergies; name and call back number provided for further questions should they arise Rockwell Alexandria RN Navigator Cardiac Imaging [facility] [phone] office [phone] cell   Pt instructed to hold ED medications prior to scan.  [place] Dec

## 2020-03-06 ENCOUNTER — Ambulatory Visit (HOSPITAL_COMMUNITY)
Admission: RE | Admit: 2020-03-06 | Discharge: 2020-03-06 | Disposition: A | Discharge status: Home / Self Care | Payer: Medicare PPO | Source: Ambulatory Visit | Attending: Cardiology | Admitting: Cardiology

## 2020-03-06 ENCOUNTER — Other Ambulatory Visit: Payer: Self-pay

## 2020-03-06 ENCOUNTER — Encounter (HOSPITAL_COMMUNITY): Payer: Self-pay

## 2020-03-06 DIAGNOSIS — I420 Dilated cardiomyopathy: Secondary | ICD-10-CM | POA: Diagnosis not present

## 2020-03-06 DIAGNOSIS — I700 Atherosclerosis of aorta: Secondary | ICD-10-CM | POA: Insufficient documentation

## 2020-03-06 DIAGNOSIS — I2510 Atherosclerotic heart disease of native coronary artery without angina pectoris: Secondary | ICD-10-CM | POA: Diagnosis not present

## 2020-03-06 MED ORDER — NITROGLYCERIN 0.4 MG SL SUBL
SUBLINGUAL_TABLET | SUBLINGUAL | Status: AC
  Filled 2020-03-06: qty 2

## 2020-03-06 MED ORDER — IOHEXOL 350 MG/ML SOLN
80.0000 mL | Freq: Once | INTRAVENOUS | Status: AC | PRN
  Administered 2020-03-06: 80 mL via INTRAVENOUS

## 2020-03-06 MED ORDER — NITROGLYCERIN 0.4 MG SL SUBL
0.8000 mg | SUBLINGUAL_TABLET | Freq: Once | SUBLINGUAL | Status: AC
  Administered 2020-03-06: 0.8 mg via SUBLINGUAL

## 2020-03-07 DIAGNOSIS — I2510 Atherosclerotic heart disease of native coronary artery without angina pectoris: Secondary | ICD-10-CM | POA: Diagnosis not present

## 2020-03-08 ENCOUNTER — Telehealth: Payer: Self-pay | Admitting: *Deleted

## 2020-03-08 NOTE — Telephone Encounter (Signed)
Patient returned call. He is scheduled 03/17/2020.

## 2020-03-08 NOTE — Telephone Encounter (Addendum)
-----   Message from [name], MD sent at 03/07/2020  7:29 AM EDT ----- Moderate CAD; continue lipitor; will await FFR; will likely treat medically. Daniel Cain  Continue medical therapy (FFR suggests significant stenosis but likely not cause of cardiomyopathy); make sure pt has fu ov.  Daniel Cain   Left message for pt to call to schedule virtual office visit with dr Jens Som

## 2020-03-08 NOTE — Telephone Encounter (Signed)
Spoke with pt, aware of CT results and need for follow up appt.

## 2020-03-09 DIAGNOSIS — I4891 Unspecified atrial fibrillation: Secondary | ICD-10-CM | POA: Diagnosis not present

## 2020-03-09 DIAGNOSIS — I10 Essential (primary) hypertension: Secondary | ICD-10-CM | POA: Diagnosis not present

## 2020-03-09 DIAGNOSIS — Z23 Encounter for immunization: Secondary | ICD-10-CM | POA: Diagnosis not present

## 2020-03-09 DIAGNOSIS — R7301 Impaired fasting glucose: Secondary | ICD-10-CM | POA: Diagnosis not present

## 2020-03-14 NOTE — Progress Notes (Signed)
Virtual Visit via Video Note   This visit type was conducted due to national recommendations for restrictions regarding the COVID-19 Pandemic (e.g. social distancing) in an effort to limit this patient's exposure and mitigate transmission in our community.  Due to his co-morbid illnesses, this patient is at least at moderate risk for complications without adequate follow up.  This format is felt to be most appropriate for this patient at this time.  All issues noted in this document were discussed and addressed.  A limited physical exam was performed with this format.  Please refer to the patient's chart for his consent to telehealth for Daniel Cain.      Date:  03/17/2020   ID:  Daniel Cain, DOB 10-29-1940, MRN [id number]  Patient Location:Home Provider Location: Home  PCP:  Daniel Gosselin, MD  Cardiologist:  Daniel Daniel Cain  Evaluation Performed:  Follow-Up Visit  Chief Complaint:  FU atrial fibrillation  History of Present Illness:    FU atrial fibrillation.Cardiac catheterization April 2003 showed normal coronary arteries and ejection fraction 45 to 50%. Nuclearstudy April 2016 showed ejection fraction 66%, artifact suggestive of left bundle branch block but no ischemia. Patientpreviously seen by Daniel. Clarene Cain with complaints of increased dyspnea on exertion. Electrocardiogram showed atrial fibrillation.Echocardiogram March 2021 showed ejection fraction 25 to 30%, mild RV dysfunction and mild right ventricular enlargement, severe biatrial enlargement,mild to moderate mitral regurgitation and tricuspid regurgitation and mildly dilated aortic root. Abdominal ultrasound March 2021 showed no aneurysm. Patient had successful cardioversion on September 01, 2019.At time of follow-up he was back in atrial fibrillation. We initiated amiodarone and he underwent cardioversion successfully June 2021. Echocardiogram September 2021 showed ejection fraction 35 to 40%, moderate left  ventricular hypertrophy, grade 1 diastolic dysfunction, moderate left atrial enlargement, mild right atrial enlargement.  Cardiac CTA October 2021 showed calcium score 326, 50 to 69% mid LAD stenosis, 25 to 49% ramus intermedius stenosis and diminutive or possibly occluded left circumflex.  FFR 0.76 in the distal LAD suggestive of flow-limiting stenosis.  Since last seenhe denies dyspnea, chest pain, palpitations or syncope.  No bleeding.  The patient does not have symptoms concerning for COVID-19 infection (fever, chills, cough, or new shortness of breath).    Past Medical History:  Diagnosis Date  . Atrial fibrillation (HCC)   . Hyperlipidemia   . Hypertension   . Left bundle branch block   . Nocturia   . Osteoarthritis   . Vertigo    Past Surgical History:  Procedure Laterality Date  . CARDIOVERSION N/A 09/01/2019   Procedure: CARDIOVERSION;  Surgeon: Daniel Ishikawa, MD;  Location: [facility] ENDOSCOPY;  Service: Cardiovascular;  Laterality: N/A;  . CARDIOVERSION N/A 11/08/2019   Procedure: CARDIOVERSION;  Surgeon: Daniel Rives, MD;  Location: [facility] ENDOSCOPY;  Service: Cardiovascular;  Laterality: N/A;  . HERNIA REPAIR    . TOTAL HIP ARTHROPLASTY Bilateral      Current Meds  Medication Sig  . amiodarone (PACERONE) 200 MG tablet Take 1 tablet (200 mg total) by mouth daily.  Marland Kitchen atorvastatin (LIPITOR) 80 MG tablet Take 80 mg by mouth daily.   . carvedilol (COREG) 12.5 MG tablet Take 1 tablet (12.5 mg total) by mouth 2 (two) times daily.  Marland Kitchen ELIQUIS 5 MG TABS tablet Take 5 mg by mouth 2 (two) times daily.   Marland Kitchen oxcarbazepine (TRILEPTAL) 600 MG tablet Take 1 tablet (600 mg total) by mouth in the morning AND 1.5 tablets (900 mg total) at bedtime.  . sacubitril-valsartan (ENTRESTO) 49-51  MG Take 1 tablet by mouth 2 (two) times daily.  . sildenafil (VIAGRA) 100 MG tablet Take 100 mg by mouth daily as needed for erectile dysfunction.     Allergies:   Patient has no known allergies.    Social History   Tobacco Use  . Smoking status: Former Games developer  . Smokeless tobacco: Never Used  Substance Use Topics  . Alcohol use: Yes    Alcohol/week: 1.0 standard drink    Types: 1 Glasses of wine per week    Comment: 1-2 glasses wine per day  . Drug use: No     Family Hx: The patient's family history includes COPD in his mother; Hypertension in his father; Stroke in his mother.  ROS:   Please see the history of present illness.    No Fever, chills  or productive cough All other systems reviewed and are negative.  Recent Labs: 02/21/2020: ALT 25; BUN 22; Creatinine, Ser 1.13; Hemoglobin 14.0; Platelets 166; Potassium 5.2; Sodium 138; TSH 2.310   Recent Lipid Panel Lab Results  Component Value Date/Time   CHOL 147 02/21/2020 09:40 AM   TRIG 62 02/21/2020 09:40 AM   HDL 54 02/21/2020 09:40 AM   CHOLHDL 2.7 02/21/2020 09:40 AM   LDLCALC 80 02/21/2020 09:40 AM    Wt Readings from Last 3 Encounters:  03/17/20 195 lb (88.5 kg)  02/03/20 196 lb 3.2 oz (89 kg)  01/27/20 197 lb 12.8 oz (89.7 kg)     Objective:    Vital Signs:  Ht 6' (1.829 m)   Wt 195 lb (88.5 kg)   BMI 26.45 kg/m    VITAL SIGNS:  reviewed NAD Answers questions appropriately Normal affect Remainder of physical examination not performed (telehealth visit; coronavirus pandemic)  ASSESSMENT & PLAN:    1. Paroxysmal atrial fibrillation-patient remains in sinus rhythm based on history.  We will continue amiodarone at present dose.  Continue apixaban. 2. Cardiomyopathy-reduced LV function appears out of proportion to coronary artery disease.  Plan to continue medical therapy with carvedilol and Entresto (change to 97/103 BID). 3. Coronary artery disease-patient denies chest pain.  We will treat medically.  We will not add aspirin given need for apixaban.  Continue statin. 4. Hypertension-blood pressure has been in the 130s at home based on his report.  Increase Entresto and  follow. 5. Hyperlipidemia-continue statin.  Most recent LDL 80.  Add Zetia 10 mg daily.  Check lipids and liver in 12 weeks. 6. Sleep apnea-managed by Daniel. Tresa Cain.  COVID-19 Education: The importance of social distancing was discussed today.  Time:   Today, I have spent 16 minutes with the patient with telehealth technology discussing the above problems.     Medication Adjustments/Labs and Tests Ordered: Current medicines are reviewed at length with the patient today.  Concerns regarding medicines are outlined above.   Tests Ordered: No orders of the defined types were placed in this encounter.   Medication Changes: No orders of the defined types were placed in this encounter.   Follow Up:  In Person in 6 month(s)  Signed, Olga Millers, MD  03/17/2020 8:08 AM    [facility]

## 2020-03-17 ENCOUNTER — Telehealth (INDEPENDENT_AMBULATORY_CARE_PROVIDER_SITE_OTHER): Payer: Medicare PPO | Admitting: Cardiology

## 2020-03-17 ENCOUNTER — Encounter: Payer: Self-pay | Admitting: Cardiology

## 2020-03-17 VITALS — Ht 72.0 in | Wt 195.0 lb

## 2020-03-17 DIAGNOSIS — E7800 Pure hypercholesterolemia, unspecified: Secondary | ICD-10-CM

## 2020-03-17 DIAGNOSIS — I420 Dilated cardiomyopathy: Secondary | ICD-10-CM

## 2020-03-17 DIAGNOSIS — I10 Essential (primary) hypertension: Secondary | ICD-10-CM

## 2020-03-17 DIAGNOSIS — I480 Paroxysmal atrial fibrillation: Secondary | ICD-10-CM

## 2020-03-17 MED ORDER — EZETIMIBE 10 MG PO TABS: 10.0000 mg | ORAL_TABLET | Freq: Every day | ORAL | 3 refills | Status: DC

## 2020-03-17 MED ORDER — ENTRESTO 97-103 MG PO TABS: 1.0000 | ORAL_TABLET | Freq: Two times a day (BID) | ORAL | 3 refills | Status: DC

## 2020-03-17 NOTE — Patient Instructions (Signed)
Medication Instructions:   START EZETIMIBE 10 MG ONCE DAILY  INCREASE ENTRESTO TO 97/103 MG TWICE DAILY = 2 OF THE 49/51 MG TABLETS TWICE DAILY  *If you need a refill on your cardiac medications before your next appointment, please call your pharmacy*   Lab Work:  Your physician recommends that you return for lab work in: ONE WEEK  If you have labs (blood work) drawn today and your tests are completely normal, you will receive your results only by: Marland Kitchen MyChart Message (if you have MyChart) OR . A paper copy in the mail If you have any lab test that is abnormal or we need to change your treatment, we will call you to review the results.  Follow-Up: At [facility], you and your health needs are our priority.  As part of our continuing mission to provide you with exceptional heart care, we have created designated Provider Care Teams.  These Care Teams include your primary Cardiologist (physician) and Advanced Practice Providers (APPs -  Physician Assistants and Nurse Practitioners) who all work together to provide you with the care you need, when you need it.  We recommend signing up for the patient portal called "MyChart".  Sign up information is provided on this After Visit Summary.  MyChart is used to connect with patients for Virtual Visits (Telemedicine).  Patients are able to view lab/test results, encounter notes, upcoming appointments, etc.  Non-urgent messages can be sent to your provider as well.   To learn more about what you can do with MyChart, go to [email or web address].    Your next appointment:   6 month(s)  The format for your next appointment:   In Person  Provider:   Olga Millers, MD

## 2020-03-24 DIAGNOSIS — I420 Dilated cardiomyopathy: Secondary | ICD-10-CM | POA: Diagnosis not present

## 2020-03-24 LAB — BASIC METABOLIC PANEL
BUN/Creatinine Ratio: 21 (ref 10–24)
BUN: 20 mg/dL (ref 8–27)
CO2: 22 mmol/L (ref 20–29)
Calcium: 8.6 mg/dL (ref 8.6–10.2)
Chloride: 103 mmol/L (ref 96–106)
Creatinine, Ser: 0.95 mg/dL (ref 0.76–1.27)
GFR calc Af Amer: 88 mL/min/{1.73_m2} (ref >59)
GFR calc non Af Amer: 76 mL/min/{1.73_m2} (ref >59)
Glucose: 101 mg/dL — ABNORMAL HIGH (ref 65–99)
Potassium: 4.7 mmol/L (ref 3.5–5.2)
Sodium: 137 mmol/L (ref 134–144)

## 2020-05-22 ENCOUNTER — Other Ambulatory Visit: Payer: Self-pay | Admitting: Cardiology

## 2020-05-29 ENCOUNTER — Telehealth: Payer: Self-pay | Admitting: Cardiology

## 2020-05-29 NOTE — Telephone Encounter (Signed)
Returned call to patient's wife no answer.LMTC. 

## 2020-05-29 NOTE — Telephone Encounter (Signed)
    Pt's wife returning call, she wanted to asked if Dr. Jens Som have the time to speak with her husband during her appt on 05/31/20. She said it doesn't have to be on that day any day will be appreciated

## 2020-05-29 NOTE — Telephone Encounter (Signed)
Spoke to patient's wife she has several questions about husband's heart condition she would like to discuss with Dr.Crenshaw.Stated she has appointment for her self with Dr.Crenshaw this Wed.She wanted to know if ok to discuss at her visit or does she need to schedule a separate appointment.Advised I will send message to Dr.Crenshaw.

## 2020-05-29 NOTE — Telephone Encounter (Signed)
Patient's wife is requesting to speak with Dr Ludwig Clarks nurse to discuss the condition of the patient's heart. Please return call.

## 2020-05-30 NOTE — Progress Notes (Signed)
Virtual Visit via Video Note changed to phone visit at patient request   This visit type was conducted due to national recommendations for restrictions regarding the COVID-19 Pandemic (e.g. social distancing) in an effort to limit this patient's exposure and mitigate transmission in our community.  Due to his co-morbid illnesses, this patient is at least at moderate risk for complications without adequate follow up.  This format is felt to be most appropriate for this patient at this time.  All issues noted in this document were discussed and addressed.  A limited physical exam was performed with this format.  Please refer to the patient's chart for his consent to telehealth for [facility].      Date:  06/02/2020   ID:  [facility], DOB February 18, 1941, MRN [id number]  Patient Location:Home Provider Location: Home  PCP:  Catha Gosselin, MD  Cardiologist:  Dr Jens Som  Evaluation Performed:  Follow-Up Visit  Chief Complaint:  FU atrial fibrillation  History of Present Illness:    FU atrial fibrillation.Cardiac catheterization April 2003 showed normal coronary arteries and ejection fraction 45 to 50%. Nuclearstudy April 2016 showed ejection fraction 66%, artifact suggestive of left bundle branch block but no ischemia. Patientpreviously seen by Dr. Clarene Duke with complaints of increased dyspnea on exertion. Electrocardiogram showed atrial fibrillation.Echocardiogram March 2021 showed ejection fraction 25 to 30%, mild RV dysfunction and mild right ventricular enlargement, severe biatrial enlargement,mild to moderate mitral regurgitation and tricuspid regurgitation and mildly dilated aortic root. Abdominal ultrasound March 2021 showed no aneurysm. Patient had successful cardioversion on September 01, 2019.At time of follow-up he was back in atrial fibrillation. We initiated amiodarone and heunderwent cardioversion successfully June 2021. Echocardiogram September 2021 showed ejection  fraction 35 to 40%, moderate left ventricular hypertrophy, grade 1 diastolic dysfunction, moderate left atrial enlargement, mild right atrial enlargement.  Cardiac CTA October 2021 showed calcium score 326, 50 to 69% mid LAD stenosis, 25 to 49% ramus intermedius stenosis and diminutive or possibly occluded left circumflex.  FFR 0.76 in the distal LAD suggestive of flow-limiting stenosis.  Since last seenthe patient denies any dyspnea on exertion, orthopnea, PND, pedal edema, palpitations, syncope or chest pain.   The patient does not have symptoms concerning for COVID-19 infection (fever, chills, cough, or new shortness of breath).    Past Medical History:  Diagnosis Date  . Atrial fibrillation (HCC)   . Hyperlipidemia   . Hypertension   . Left bundle branch block   . Nocturia   . Osteoarthritis   . Vertigo    Past Surgical History:  Procedure Laterality Date  . CARDIOVERSION N/A 09/01/2019   Procedure: CARDIOVERSION;  Surgeon: Little Ishikawa, MD;  Location[facility] ENDOSCOPY;  Service: Cardiovascular;  Laterality: N/A;  . CARDIOVERSION N/A 11/08/2019   Procedure: CARDIOVERSION;  Surgeon: Sande Rives, MD;  Location: [facility] ENDOSCOPY;  Service: Cardiovascular;  Laterality: N/A;  . HERNIA REPAIR    . TOTAL HIP ARTHROPLASTY Bilateral      Current Meds  Medication Sig  . amiodarone (PACERONE) 200 MG tablet Take 1 tablet (200 mg total) by mouth daily.  Marland Kitchen atorvastatin (LIPITOR) 80 MG tablet Take 80 mg by mouth daily.   . carvedilol (COREG) 25 MG tablet Take 25 mg by mouth 2 (two) times daily with a meal.  . ELIQUIS 5 MG TABS tablet TAKE 1 TABLET BY MOUTH TWICE DAILY.  Marland Kitchen ezetimibe (ZETIA) 10 MG tablet Take 1 tablet (10 mg total) by mouth daily.  Marland Kitchen oxcarbazepine (TRILEPTAL) 600 MG  tablet Take 1 tablet (600 mg total) by mouth in the morning AND 1.5 tablets (900 mg total) at bedtime.  . sacubitril-valsartan (ENTRESTO) 97-103 MG Take 1 tablet by mouth 2 (two) times daily.  .  sildenafil (VIAGRA) 100 MG tablet Take 100 mg by mouth daily as needed for erectile dysfunction.  . [DISCONTINUED] carvedilol (COREG) 12.5 MG tablet Take 1 tablet (12.5 mg total) by mouth 2 (two) times daily.     Allergies:   Patient has no known allergies.   Social History   Tobacco Use  . Smoking status: Former Games developer  . Smokeless tobacco: Never Used  Substance Use Topics  . Alcohol use: Yes    Alcohol/week: 1.0 standard drink    Types: 1 Glasses of wine per week    Comment: 1-2 glasses wine per day  . Drug use: No     Family Hx: The patient's family history includes COPD in his mother; Hypertension in his father; Stroke in his mother.  ROS:   Please see the history of present illness.    No Fever, chills  or productive cough All other systems reviewed and are negative.   Recent Labs: 02/21/2020: ALT 25; Hemoglobin 14.0; Platelets 166; TSH 2.310 03/24/2020: BUN 20; Creatinine, Ser 0.95; Potassium 4.7; Sodium 137   Recent Lipid Panel Lab Results  Component Value Date/Time   CHOL 147 02/21/2020 09:40 AM   TRIG 62 02/21/2020 09:40 AM   HDL 54 02/21/2020 09:40 AM   CHOLHDL 2.7 02/21/2020 09:40 AM   LDLCALC 80 02/21/2020 09:40 AM    Wt Readings from Last 3 Encounters:  06/02/20 195 lb (88.5 kg)  03/17/20 195 lb (88.5 kg)  02/03/20 196 lb 3.2 oz (89 kg)     Objective:    Vital Signs:  Ht 6' (1.829 m)   Wt 195 lb (88.5 kg)   BMI 26.45 kg/m    VITAL SIGNS:  reviewed NAD Answers questions appropriately Normal affect Remainder of physical examination not performed (telehealth visit; coronavirus pandemic)  ASSESSMENT & PLAN:    1. PAF-continue amiodarone and apixaban.  Check chest x-ray.  Recent TSH and liver functions normal. 2. CM-out of proportion to CAD. Continue entresto and coreg. Repeat echo in 6 months.  3. CAD-continue statin; no ASA given need for apixaban. 4. Hypertension-continue present meds and follow. 5. hyperlipidemia-continue statin.   6. OSA-per Dr Tresa Endo.  COVID-19 Education: The importance of social distancing was discussed today.  Time:   Today, I have spent 16 minutes with the patient with telehealth technology discussing the above problems.     Medication Adjustments/Labs and Tests Ordered: Current medicines are reviewed at length with the patient today.  Concerns regarding medicines are outlined above.   Tests Ordered: No orders of the defined types were placed in this encounter.   Medication Changes: No orders of the defined types were placed in this encounter.   Follow Up:  In Person in 6 month(s)  Signed, Olga Millers, MD  06/02/2020 7:55 AM    [facility]

## 2020-05-30 NOTE — Telephone Encounter (Signed)
Left message for pt to call to schedule virtual office visit with dr Jens Som.

## 2020-05-30 NOTE — Telephone Encounter (Signed)
Schedule fuov [facility]  

## 2020-05-31 NOTE — Telephone Encounter (Signed)
Follow up scheduled

## 2020-06-02 ENCOUNTER — Encounter: Payer: Self-pay | Admitting: Cardiology

## 2020-06-02 ENCOUNTER — Telehealth (INDEPENDENT_AMBULATORY_CARE_PROVIDER_SITE_OTHER): Payer: Medicare PPO | Admitting: Cardiology

## 2020-06-02 VITALS — Ht 72.0 in | Wt 195.0 lb

## 2020-06-02 DIAGNOSIS — I480 Paroxysmal atrial fibrillation: Secondary | ICD-10-CM

## 2020-06-02 DIAGNOSIS — I10 Essential (primary) hypertension: Secondary | ICD-10-CM

## 2020-06-02 DIAGNOSIS — I2510 Atherosclerotic heart disease of native coronary artery without angina pectoris: Secondary | ICD-10-CM | POA: Diagnosis not present

## 2020-06-02 DIAGNOSIS — I420 Dilated cardiomyopathy: Secondary | ICD-10-CM

## 2020-06-02 DIAGNOSIS — E7800 Pure hypercholesterolemia, unspecified: Secondary | ICD-10-CM | POA: Diagnosis not present

## 2020-06-02 NOTE — Patient Instructions (Signed)
  Testing/Procedures:  A chest x-ray takes a picture of the organs and structures inside the chest, including the heart, lungs, and blood vessels. This test can show several things, including, whether the heart is enlarges; whether fluid is building up in the lungs; and whether pacemaker / defibrillator leads are still in place. [address]= IMAGING   Follow-Up: At [facility], you and your health needs are our priority.  As part of our continuing mission to provide you with exceptional heart care, we have created designated Provider Care Teams.  These Care Teams include your primary Cardiologist (physician) and Advanced Practice Providers (APPs -  Physician Assistants and Nurse Practitioners) who all work together to provide you with the care you need, when you need it.  We recommend signing up for the patient portal called "MyChart".  Sign up information is provided on this After Visit Summary.  MyChart is used to connect with patients for Virtual Visits (Telemedicine).  Patients are able to view lab/test results, encounter notes, upcoming appointments, etc.  Non-urgent messages can be sent to your provider as well.   To learn more about what you can do with MyChart, go to [email or web address].    Your next appointment:    AS SCHEDULED

## 2020-06-06 ENCOUNTER — Ambulatory Visit
Admission: RE | Admit: 2020-06-06 | Discharge: 2020-06-06 | Disposition: A | Discharge status: Home / Self Care | Payer: Medicare PPO | Source: Ambulatory Visit | Attending: Cardiology | Admitting: Cardiology

## 2020-06-06 ENCOUNTER — Other Ambulatory Visit: Payer: Self-pay

## 2020-06-06 DIAGNOSIS — I480 Paroxysmal atrial fibrillation: Secondary | ICD-10-CM

## 2020-06-06 DIAGNOSIS — R059 Cough, unspecified: Secondary | ICD-10-CM | POA: Diagnosis not present

## 2020-06-14 ENCOUNTER — Telehealth: Payer: Self-pay | Admitting: Cardiology

## 2020-06-14 NOTE — Telephone Encounter (Signed)
Pt c/o medication issue:  1. Name of Medication: carvedilol (COREG) 25 MG tablet   2. How are you currently taking this medication (dosage and times per day)? 12.5 MG twice a day.  3. Are you having a reaction (difficulty breathing--STAT)? No.   4. What is your medication issue? Patient states that he's been taking 12.5 MG of this medication twice a day and on his after visit summary from his virtual appt on 06/02/2020 it calls for him to take 25 mg by mouth twice a day. He would like to know which is the correct dosage. Please advise.

## 2020-06-14 NOTE — Telephone Encounter (Signed)
Carvedilol should be 12.5mg  BID. Medication list updated. Voicemail left for patient to clarify dosage and requested patient to call back if needing refill or has questions.

## 2020-06-14 NOTE — Telephone Encounter (Signed)
12.5 BID Olga Millers

## 2020-08-03 ENCOUNTER — Encounter: Payer: Self-pay | Admitting: Adult Health

## 2020-08-03 ENCOUNTER — Ambulatory Visit: Payer: Medicare PPO | Admitting: Adult Health

## 2020-08-03 VITALS — BP 146/79 | HR 53 | Ht 72.0 in | Wt 204.0 lb

## 2020-08-03 DIAGNOSIS — H8190 Unspecified disorder of vestibular function, unspecified ear: Secondary | ICD-10-CM | POA: Diagnosis not present

## 2020-08-03 MED ORDER — OXCARBAZEPINE 600 MG PO TABS: ORAL_TABLET | ORAL | 3 refills | Status: DC

## 2020-08-03 NOTE — Patient Instructions (Signed)
Your Plan:  Continue Trileptal 600mg  AM and 900mg  PM      Follow-up in 6 months or call earlier if needed     Thank you for coming to see at [facility]. I hope we have been able to provide you high quality care today.  You may receive a patient satisfaction survey over the next few weeks. We would appreciate your feedback and comments so that we may continue to improve ourselves and the health of our patients.

## 2020-08-03 NOTE — Progress Notes (Signed)
NEUROLOGY CLINIC FOLLOW UP PATIENT NOTE  NAME: Daniel Cain DOB: 1941/03/16    Chief complaint: Chief Complaint  Patient presents with  . Follow-up    Rm 14 alone Pt is well and stable, had one episode in Jan      HPI:  Today, 08/03/2020, Daniel Cain returns for 39-month scheduled follow-up unaccompanied   Doing well since prior visit reporting one event in January which was typical and lasted approximately 1 hour.  No other events since that time Remains on Trileptal 600/900 -tolerating without side effects  Routine follow-up cardiology for PAF, CM and CAD - stable from their standpoint Blood pressure today 146/79  No new concerns at this time     History provided for reference purposes only Update 02/03/2020 JM: [facility] returns for follow-up visit unaccompanied Episodes of lightheadedness, vertigo, imbalance and nausea/vomiting previously discussed Per cardiology, his episodes were not felt to be related to atrial fibrillation therefore Trileptal dosage adjusted to 600 mg a.m. and 900 mg p.m.. He has been doing well since increasing dosage tolerating well without recurrent episodes.  He underwent successful cardioversion on 11/08/2019 which improved SOB symptoms Blood pressure today 122/78 Diagnosis of sleep apnea with scheduled titration study but unfortunately found to be positive for Covid therefore titration study canceled.  He has not been called to reschedule testing. No further concerns at this time  Update 10/05/2019 JM: [facility] returns by patient request accompanied by his wife due to recent onset of lightheadedness, vertigo and nausea. Prior visit 01/28/2019 stable at that time. Routine follow-up being seen for prior similar episodes initially evaluated by Dr. Roda Cain with unclear etiology and Ddx vestibular paroxysmia vs basilar type migraine vs mid pontine small capillary telangectasia related (as demonstrated on MRI). Prior work-up negative for BPPV,  stroke or intracranial stenosis. Evaluation by ENT consider questionable Mnire's disease. Trileptal initiated in 04/2015 with only one recurrent episode in 06/2015 and has been stable on 600 mg twice daily dosing without recurrent episodes until recently. He had recurrent symptoms of initially feeling lightheaded and then progressing to room spinning, imbalance and difficulty standing eventually accompanied by nausea and vomiting. Symptoms typically last 4 to 6 hours and are debilitating where he will typically lay down and go to sleep. Resolution of symptoms by the time he wakes up. He also experienced an additional episode on 10/01/2019. He endorses ongoing compliance with Trileptal 600 mg twice daily. He was recently diagnosed with atrial fibrillation in 06/2019 with initiation of apixaban and undergoing cardioversion on 09/01/2019 initially converted but unfortunately shortly returned back into atrial fibrillation. Atrial fibrillation found after visit with PCP, Dr. Clarene Cain, for complaints of dyspnea on exertion. During recurrent episodes, he denies dyspnea, chest pains, migraine or headache, palpitations or syncope.       ROS: Review of Systems  Constitutional: Negative.   HENT: Negative.   Eyes: Negative.   Respiratory: Negative.   Cardiovascular: Negative.   Genitourinary: Negative.   Musculoskeletal: Negative.   Skin: Negative.   Endo/Heme/Allergies: Negative.   Psychiatric/Behavioral: Negative.        Past Medical History:  Diagnosis Date  . Atrial fibrillation (HCC)   . Hyperlipidemia   . Hypertension   . Left bundle branch block   . Nocturia   . Osteoarthritis   . Vertigo    Past Surgical History:  Procedure Laterality Date  . CARDIOVERSION N/A 09/01/2019   Procedure: CARDIOVERSION;  Surgeon: Daniel Ishikawa, MD;  Location: [facility] END[facility];  Service: Cardiovascular;  Laterality: N/A;  . CARDIOVERSION N/A 11/08/2019   Procedure: CARDIOVERSION;  Surgeon: Daniel Rives, MD;  Location: [facility];  Service: Cardiovascular;  Laterality: N/A;  . HERNIA REPAIR    . TOTAL HIP ARTHROPLASTY Bilateral    Family History  Problem Relation Age of Onset  . COPD Mother   . Stroke Mother   . Hypertension Father    Current Outpatient Medications  Medication Sig Dispense Refill  . amiodarone (PACERONE) 200 MG tablet Take 1 tablet (200 mg total) by mouth daily. 90 tablet 3  . atorvastatin (LIPITOR) 80 MG tablet Take 80 mg by mouth daily.     . carvedilol (COREG) 25 MG tablet Take 12.5 mg by mouth 2 (two) times daily with a meal.    . ELIQUIS 5 MG TABS tablet TAKE 1 TABLET BY MOUTH TWICE DAILY. 180 tablet 1  . ezetimibe (ZETIA) 10 MG tablet Take 1 tablet (10 mg total) by mouth daily. 90 tablet 3  . oxcarbazepine (TRILEPTAL) 600 MG tablet Take 1 tablet (600 mg total) by mouth in the morning AND 1.5 tablets (900 mg total) at bedtime. 225 tablet 3  . sacubitril-valsartan (ENTRESTO) 97-103 MG Take 1 tablet by mouth 2 (two) times daily. 180 tablet 3  . sildenafil (VIAGRA) 100 MG tablet Take 100 mg by mouth daily as needed for erectile dysfunction.     No current facility-administered medications for this visit.   No Known Allergies Social History   Socioeconomic History  . Marital status: Married    Spouse name: Not on file  . Number of children: 3  . Years of education: Not on file  . Highest education level: Not on file  Occupational History  . Not on file  Tobacco Use  . Smoking status: Former Games developer  . Smokeless tobacco: Never Used  Substance and Sexual Activity  . Alcohol use: Yes    Alcohol/week: 1.0 standard drink    Types: 1 Glasses of wine per week    Comment: 1-2 glasses wine per day  . Drug use: No  . Sexual activity: Never    Birth control/protection: Abstinence  Other Topics Concern  . Not on file  Social History Narrative  . Not on file   Social Determinants of Health   Financial Resource Strain: Not on file  Food Insecurity:  Not on file  Transportation Needs: Not on file  Physical Activity: Not on file  Stress: Not on file  Social Connections: Not on file  Intimate Partner Violence: Not on file     Physical Exam  Today's Vitals   08/03/20 1236  BP: (!) 146/79  Pulse: (!) 53  Weight: 204 lb (92.5 kg)  Height: 6' (1.829 m)   Body mass index is 27.67 kg/m.   General - Well nourished, well developed, very pleasant elderly Caucasian male, in no apparent distress. Cardiovascular -regular rate and rhythm  Mental Status -  Level of arousal and orientation to time, place, and person were intact. Language including expression, naming, repetition, comprehension, reading, and writing was assessed and found intact. Attention span and concentration were normal. Recent and remote memory were intact. Fund of Knowledge was assessed and was intact.  Cranial Nerves II - XII - II - Visual field intact OU. III, IV, VI - Extraocular movements intact. V - Facial sensation intact bilaterally. VII - Facial movement intact bilaterally. VIII - Hearing & vestibular intact bilaterally. X - Palate elevates symmetrically. XI - Chin turning & shoulder shrug intact bilaterally.  XII - Tongue protrusion intact. Motor Strength - The patient's strength was normal in all extremities and pronator drift was absent.  Bulk was normal and fasciculations were absent.   Motor Tone - Muscle tone was assessed at the neck and appendages and was normal. Reflexes - The patient's reflexes were normal in all extremities and he had no pathological reflexes. Sensory - Light touch, temperature/pinprick, vibration and proprioception, and Romberg testing were assessed and were normal.   Coordination - The patient had normal movements in the hands and feet with no ataxia or dysmetria.  Tremor was absent. Gait and Station - The patient's transfers, posture, gait, station, and turns were observed as normal.      Assessment:   Alp Goldwater  is a 80 year old male with PMH of HLD and HTN with recurrent episodes consisting of lightheadedness, dizziness, nausea and vomiting who was started on Trileptal low-dose in 04/2015. Additional episode 06/2015 with Trileptal dosage increased to 600 mg twice daily without recurrent episodes until 05/2019 and 09/2019 with increase Trileptal dosage. One event in 05/2020. Ddx includes vestibular paroxymia vs basilar migraine vs mid pontine small capillary telangectasia related vs possible Mnire's disease. Dx'd atrial fibrillation in 06/2019 undergoing unsuccessful cardioversion 08/2019 with repeat successful cardioversion 10/2019.    Plan: -continue Trileptal 600/900 -no indication for dosage adjustment unless events become more frequent.  May also consider use of abortive therapy in the future if indicated -Lab work 01/2020 satisfactory - will plan on repeating at follow-up visit    Follow-up in 6 months or call earlier if needed   CC:  [facility] provider: Dr. Brett Albino, Caryn Bee, MD    I spent 20 minutes of face-to-face and non-face-to-face time with patient.  This included previsit chart review, lab review, study review, order entry, electronic health record documentation, patient education regarding recurrent episodes and with recent breakthrough and ongoing use of Trileptal dosage, and answered all other questions to patients satisfaction   Ihor Austin, [place]  [facility] [address] [place], [place] [address]  Phone [phone] Fax [phone] Note: This document was prepared with digital dictation and possible smart phrase technology. Any transcriptional errors that result from this process are unintentional.

## 2020-08-12 NOTE — Progress Notes (Unsigned)
HPI: FU atrial fibrillation.Cardiac catheterization April 2003 showed normal coronary arteries and ejection fraction 45 to 50%. Nuclearstudy April 2016 showed ejection fraction 66%, artifact suggestive of left bundle branch block but no ischemia. Patientpreviously seen by Dr. Clarene Duke with complaints of increased dyspnea on exertion. Electrocardiogram showed atrial fibrillation.Echocardiogram March 2021 showed ejection fraction 25 to 30%, mild RV dysfunction and mild right ventricular enlargement, severe biatrial enlargement,mild to moderate mitral regurgitation and tricuspid regurgitation and mildly dilated aortic root. Abdominal ultrasound March 2021 showed no aneurysm. Patient had successful cardioversion on September 01, 2019.At time of follow-up he was back in atrial fibrillation. We initiated amiodarone and heunderwent cardioversion successfully June 2021. Echocardiogram September 2021 showed ejection fraction 35 to 40%, moderate left ventricular hypertrophy, grade 1 diastolic dysfunction, moderate left atrial enlargement, mild right atrial enlargement. Cardiac CTA October 2021 showed calcium score 326, 50 to 69% mid LAD stenosis, 25 to 49% ramus intermedius stenosis and diminutive or possibly occluded left circumflex. FFR 0.76 in the distal LAD suggestive of flow-limiting stenosis. Since last seen he denies dyspnea, chest pain, palpitations or syncope.  Current Outpatient Medications  Medication Sig Dispense Refill  . amiodarone (PACERONE) 200 MG tablet Take 1 tablet (200 mg total) by mouth daily. 90 tablet 3  . atorvastatin (LIPITOR) 80 MG tablet Take 80 mg by mouth daily.     . carvedilol (COREG) 25 MG tablet Take 12.5 mg by mouth 2 (two) times daily with a meal.    . ELIQUIS 5 MG TABS tablet TAKE 1 TABLET BY MOUTH TWICE DAILY. 180 tablet 1  . ezetimibe (ZETIA) 10 MG tablet Take 1 tablet (10 mg total) by mouth daily. 90 tablet 3  . oxcarbazepine (TRILEPTAL) 600 MG tablet Take 1  tablet (600 mg total) by mouth in the morning AND 1.5 tablets (900 mg total) at bedtime. 225 tablet 3  . sacubitril-valsartan (ENTRESTO) 97-103 MG Take 1 tablet by mouth 2 (two) times daily. 180 tablet 3  . sildenafil (VIAGRA) 100 MG tablet Take 100 mg by mouth daily as needed for erectile dysfunction.     No current facility-administered medications for this visit.     Past Medical History:  Diagnosis Date  . Atrial fibrillation (HCC)   . Hyperlipidemia   . Hypertension   . Left bundle branch block   . Nocturia   . Osteoarthritis   . Vertigo     Past Surgical History:  Procedure Laterality Date  . CARDIOVERSION N/A 09/01/2019   Procedure: CARDIOVERSION;  Surgeon: Little Ishikawa, MD;  Location[facility] ENDOSCOPY;  Service: Cardiovascular;  Laterality: N/A;  . CARDIOVERSION N/A 11/08/2019   Procedure: CARDIOVERSION;  Surgeon: Sande Rives, MD;  Location: [facility] ENDOSCOPY;  Service: Cardiovascular;  Laterality: N/A;  . HERNIA REPAIR    . TOTAL HIP ARTHROPLASTY Bilateral     Social History   Socioeconomic History  . Marital status: Married    Spouse name: Not on file  . Number of children: 3  . Years of education: Not on file  . Highest education level: Not on file  Occupational History  . Not on file  Tobacco Use  . Smoking status: Former Games developer  . Smokeless tobacco: Never Used  Substance and Sexual Activity  . Alcohol use: Yes    Alcohol/week: 1.0 standard drink    Types: 1 Glasses of wine per week    Comment: 1-2 glasses wine per day  . Drug use: No  . Sexual activity: Never  Birth control/protection: Abstinence  Other Topics Concern  . Not on file  Social History Narrative  . Not on file   Social Determinants of Health   Financial Resource Strain: Not on file  Food Insecurity: Not on file  Transportation Needs: Not on file  Physical Activity: Not on file  Stress: Not on file  Social Connections: Not on file  Intimate Partner Violence: Not on  file    Family History  Problem Relation Age of Onset  . COPD Mother   . Stroke Mother   . Hypertension Father     ROS: no fevers or chills, productive cough, hemoptysis, dysphasia, odynophagia, melena, hematochezia, dysuria, hematuria, rash, seizure activity, orthopnea, PND, pedal edema, claudication. Remaining systems are negative.  Physical Exam: Well-developed well-nourished in no acute distress.  Skin is warm and dry.  HEENT is normal.  Neck is supple.  Chest is clear to auscultation with normal expansion.  Cardiovascular exam is regular rate and rhythm.  Abdominal exam nontender or distended. No masses palpated. Extremities show no edema. neuro grossly intact  ECG-sinus bradycardia at a rate of 44, left bundle branch block.  Personally reviewed  A/P  1 paroxysmal atrial fibrillation-patient remains in sinus rhythm.  Continue amiodarone and apixaban.  Recent chest x-ray unremarkable.  Check TSH and liver functions.  2 cardiomyopathy-out of proportion to coronary disease.  Continue Entresto and carvedilol.  Note his heart rate is 44 today.  Decrease carvedilol from 12-1/2 twice daily to 6.25 twice daily.  We will arrange follow-up echocardiogram in 3 months.  3 coronary artery disease-continue statin.  He is not on aspirin given need for apixaban.  4 hypertension-blood pressure controlled.  Continue present medications.  5 hyperlipidemia-continue statin.  Check lipids and liver.  6 obstructive sleep apnea-managed by Dr. Tresa Endo.  Olga Millers, MD

## 2020-08-13 NOTE — Progress Notes (Signed)
I agree with the above plan 

## 2020-08-15 ENCOUNTER — Encounter: Payer: Self-pay | Admitting: Cardiology

## 2020-08-15 ENCOUNTER — Ambulatory Visit: Payer: Medicare PPO | Admitting: Cardiology

## 2020-08-15 ENCOUNTER — Other Ambulatory Visit: Payer: Self-pay

## 2020-08-15 VITALS — BP 126/76 | HR 44 | Ht 72.0 in | Wt 202.0 lb

## 2020-08-15 DIAGNOSIS — E7800 Pure hypercholesterolemia, unspecified: Secondary | ICD-10-CM

## 2020-08-15 DIAGNOSIS — I480 Paroxysmal atrial fibrillation: Secondary | ICD-10-CM

## 2020-08-15 DIAGNOSIS — I420 Dilated cardiomyopathy: Secondary | ICD-10-CM | POA: Diagnosis not present

## 2020-08-15 DIAGNOSIS — I10 Essential (primary) hypertension: Secondary | ICD-10-CM

## 2020-08-15 DIAGNOSIS — I2510 Atherosclerotic heart disease of native coronary artery without angina pectoris: Secondary | ICD-10-CM

## 2020-08-15 MED ORDER — CARVEDILOL 6.25 MG PO TABS: 6.2500 mg | ORAL_TABLET | Freq: Two times a day (BID) | ORAL | 3 refills | Status: DC

## 2020-08-15 NOTE — Patient Instructions (Signed)
Medication Instructions:   DECREASE CARVEDILOL TO 6.25 MG TWICE DAILY  *If you need a refill on your cardiac medications before your next appointment, please call your pharmacy*   Lab Work:  Your physician recommends that you return for lab work FASTING  If you have labs (blood work) drawn today and your tests are completely normal, you will receive your results only by: Marland Kitchen MyChart Message (if you have MyChart) OR . A paper copy in the mail If you have any lab test that is abnormal or we need to change your treatment, we will call you to review the results.   Testing/Procedures:  Your physician has requested that you have an echocardiogram. Echocardiography is a painless test that uses sound waves to create images of your heart. It provides your doctor with information about the size and shape of your heart and how well your heart's chambers and valves are working. This procedure takes approximately one hour. There are no restrictions for this procedure.[address]-SCHEDULE IN 3 MONTHS     Follow-Up: At [facility], you and your health needs are our priority.  As part of our continuing mission to provide you with exceptional heart care, we have created designated Provider Care Teams.  These Care Teams include your primary Cardiologist (physician) and Advanced Practice Providers (APPs -  Physician Assistants and Nurse Practitioners) who all work together to provide you with the care you need, when you need it.  We recommend signing up for the patient portal called "MyChart".  Sign up information is provided on this After Visit Summary.  MyChart is used to connect with patients for Virtual Visits (Telemedicine).  Patients are able to view lab/test results, encounter notes, upcoming appointments, etc.  Non-urgent messages can be sent to your provider as well.   To learn more about what you can do with MyChart, go to [email or web address].    Your next appointment:   6  month(s)  The format for your next appointment:   In Person  Provider:   Olga Millers, MD

## 2020-08-29 ENCOUNTER — Other Ambulatory Visit: Payer: Self-pay

## 2020-08-29 DIAGNOSIS — I2510 Atherosclerotic heart disease of native coronary artery without angina pectoris: Secondary | ICD-10-CM

## 2020-08-29 DIAGNOSIS — E7800 Pure hypercholesterolemia, unspecified: Secondary | ICD-10-CM

## 2020-08-29 LAB — LIPID PANEL
Chol/HDL Ratio: 2.3 ratio (ref 0.0–5.0)
Cholesterol, Total: 122 mg/dL (ref 100–199)
HDL: 53 mg/dL (ref >39)
LDL Chol Calc (NIH): 55 mg/dL (ref 0–99)
Triglycerides: 69 mg/dL (ref 0–149)
VLDL Cholesterol Cal: 14 mg/dL (ref 5–40)

## 2020-08-29 LAB — HEPATIC FUNCTION PANEL
ALT: 26 IU/L (ref 0–44)
AST: 20 IU/L (ref 0–40)
Albumin: 4.1 g/dL (ref 3.7–4.7)
Alkaline Phosphatase: 75 IU/L (ref 44–121)
Bilirubin Total: 0.5 mg/dL (ref 0.0–1.2)
Bilirubin, Direct: 0.16 mg/dL (ref 0.00–0.40)
Total Protein: 6.3 g/dL (ref 6.0–8.5)

## 2020-09-01 ENCOUNTER — Encounter: Payer: Self-pay | Admitting: *Deleted

## 2020-09-16 ENCOUNTER — Telehealth: Payer: Self-pay | Admitting: Student

## 2020-09-16 NOTE — Telephone Encounter (Signed)
   Wife called Answering Service with questions about restarting Entresto. Called and spoke with wife. Patient is on Entresto 97-103mg  twice daily. He was off this for 10 days. He gets his medications delivered and they were late coming in. Discussed with Dr. Jens Som who reported it was fine for patient to start back at previous dose as long as patient has tolerated it well in the past (no need to start on lower dose and then increase). Wife states she thinks he has tolerated it well in the past but will confirm with patient. Did recommended monitoring BP after restarting this to ensure that it does not drop too low. Per Dr. Jens Som, also no need to repeat BMET after restarting if he has tolerated it well in the past.   Corrin Parker, PA-C 09/16/2020 2:03 PM

## 2020-10-29 ENCOUNTER — Other Ambulatory Visit: Payer: Self-pay | Admitting: Cardiology

## 2020-11-15 ENCOUNTER — Other Ambulatory Visit (HOSPITAL_COMMUNITY): Payer: Medicare PPO

## 2020-11-22 ENCOUNTER — Other Ambulatory Visit: Payer: Self-pay | Admitting: Cardiology

## 2020-11-23 NOTE — Telephone Encounter (Signed)
34m, 91.6kg, scr 0.95 03/24/20, lovw/crenshaw 08/15/20

## 2020-12-06 ENCOUNTER — Ambulatory Visit (HOSPITAL_COMMUNITY): Payer: Medicare PPO | Attending: Cardiology

## 2020-12-06 ENCOUNTER — Other Ambulatory Visit: Payer: Self-pay

## 2020-12-06 DIAGNOSIS — I420 Dilated cardiomyopathy: Secondary | ICD-10-CM | POA: Diagnosis present

## 2020-12-06 LAB — ECHOCARDIOGRAM COMPLETE
Area-P 1/2: 1.59 cm2
S' Lateral: 3.95 cm

## 2021-01-09 ENCOUNTER — Other Ambulatory Visit: Payer: Self-pay | Admitting: Urology

## 2021-01-11 ENCOUNTER — Other Ambulatory Visit: Payer: Self-pay | Admitting: Urology

## 2021-02-05 NOTE — Progress Notes (Addendum)
COVID swab appointment:  N/A  COVID Vaccine Completed:  Yes x3 Date COVID Vaccine completed: Has received booster: Yes x1 02-29-20, scheduled to get 2nd booster COVID vaccine manufacturer: Pfizer    [employer]   Date of COVID positive in last 90 days:  No  PCP - Meridee Score, PA Cardiologist - Olga Millers, MD  Chest x-ray - 06-06-20 Epic EKG - 08-15-20 Epic Stress Test - greater than 2 years.  Epic ECHO - 12-06-20 Epic Cardiac Cath - greater than 2 years.  Epic Pacemaker/ICD device last checked: Spinal Cord Stimulator: Cardiac/Coronary CTA - 03-06-20 Epic  Sleep Study - Yes 2021, Epic.  +sleep apnea CPAP -  No  Fasting Blood Sugar - N/A Checks Blood Sugar _____ times a day  Blood Thinner Instructions:  Eliquis 5 mg.  Last dose 02-05-21 Aspirin Instructions: Last Dose:  Activity level:  Can go up a flight of stairs and perform activities of daily living without stopping and without symptoms of chest pain or shortness of breath.  Able to exercise without symptoms   Anesthesia review:  Afib with history of cardioversion, LBBB, Cardiomyopathy, HTN, OSA  Patient denies shortness of breath, fever, cough and chest pain at PAT appointment   Patient verbalized understanding of instructions that were given to them at the PAT appointment. Patient was also instructed that they will need to review over the PAT instructions again at home before surgery.

## 2021-02-05 NOTE — Progress Notes (Signed)
Left voice mail message with selita at dr dahlstedt, pt does not meet [facility] guidelines due to ef 35 %. [facility].

## 2021-02-05 NOTE — Patient Instructions (Addendum)
DUE TO COVID-19 ONLY ONE VISITOR IS ALLOWED TO COME WITH YOU AND STAY IN THE WAITING ROOM ONLY DURING PRE OP AND PROCEDURE.   **NO VISITORS ARE ALLOWED IN THE SHORT STAY AREA OR RECOVERY ROOM!!**         Your procedure is scheduled on:  Thursday, 02-08-21   Report to [facility] Main  Entrance   Report to Short Stay at 5:15 AM   [facility])   Call this number if you have problems the morning of surgery [phone]   Do not eat food :After Midnight.   May have liquids until 4:30 AM day of surgery  CLEAR LIQUID DIET  Foods Allowed                                                                     Foods Excluded  Water, Black Coffee (no milk/no creamer) and tea, regular and decaf                              liquids that you cannot  Plain Jell-O in any flavor  (No red)                         see through such as: Fruit ices (not with fruit pulp)                                 milk, soups, orange juice  Iced Popsicles (No red)                                    All solid food                             Apple juices Sports drinks like Gatorade (No red) Lightly seasoned clear broth or consume(fat free) Sugar      Oral Hygiene is also important to reduce your risk of infection.                                    Remember - BRUSH YOUR TEETH THE MORNING OF SURGERY WITH YOUR REGULAR TOOTHPASTE   Do NOT smoke after Midnight   Take these medicines the morning of surgery with A SIP OF WATER: Amiodarone, Atorvastatin, Carvedilol, Ezetimibe, Oxcarbazepine                    Eliquis - Last dose 02-05-21   Stop all vitamins and herbal supplements a week before surgery             You may not have any metal on your body including  jewelry, and body piercing             Do not wear lotions, powders, cologne, or deodorant              Men may shave face and neck.  Do not bring valuables to the hospital. [facility]  IS NOT RESPONSIBLE FOR VALUABLES.   Contacts, dentures  or bridgework may not be worn into surgery.    Patients discharged the day of surgery will not be allowed to drive home.  Special Instructions: Bring a copy of your healthcare power of attorney and living will documents the day of surgery if you haven't scanned them in before.  Please read over the following fact sheets you were given: IF YOU HAVE QUESTIONS ABOUT YOUR PRE OP INSTRUCTIONS PLEASE CALL [phone] [facility] - Preparing for Surgery Before surgery, you can play an important role.  Because skin is not sterile, your skin needs to be as free of germs as possible.  You can reduce the number of germs on your skin by washing with CHG (chlorahexidine gluconate) soap before surgery.  CHG is an antiseptic cleaner which kills germs and bonds with the skin to continue killing germs even after washing. Please DO NOT use if you have an allergy to CHG or antibacterial soaps.  If your skin becomes reddened/irritated stop using the CHG and inform your nurse when you arrive at Short Stay. Do not shave (including legs and underarms) for at least 48 hours prior to the first CHG shower.  You may shave your face/neck.  Please follow these instructions carefully:  1.  Shower with CHG Soap the night before surgery and the  morning of surgery.  2.  If you choose to wash your hair, wash your hair first as usual with your normal  shampoo.  3.  After you shampoo, rinse your hair and body thoroughly to remove the shampoo.                             4.  Use CHG as you would any other liquid soap.  You can apply chg directly to the skin and wash.  Gently with a scrungie or clean washcloth.  5.  Apply the CHG Soap to your body ONLY FROM THE NECK DOWN.   Do   not use on face/ open                           Wound or open sores. Avoid contact with eyes, ears mouth and   genitals (private parts).                       Wash face,  Genitals (private parts) with your normal soap.             6.  Wash  thoroughly, paying special attention to the area where your    surgery  will be performed.  7.  Thoroughly rinse your body with warm water from the neck down.  8.  DO NOT shower/wash with your normal soap after using and rinsing off the CHG Soap.                9.  Pat yourself dry with a clean towel.            10.  Wear clean pajamas.            11.  Place clean sheets on your bed the night of your first shower and do not  sleep with pets. Day of Surgery : Do not apply any lotions/deodorants the morning of surgery.  Please wear clean clothes to the hospital/surgery center.  FAILURE TO FOLLOW THESE INSTRUCTIONS MAY  RESULT IN THE CANCELLATION OF YOUR SURGERY  PATIENT SIGNATURE_________________________________  NURSE SIGNATURE__________________________________  ________________________________________________________________________

## 2021-02-06 ENCOUNTER — Other Ambulatory Visit: Payer: Self-pay

## 2021-02-06 ENCOUNTER — Encounter (HOSPITAL_COMMUNITY)
Admission: RE | Admit: 2021-02-06 | Discharge: 2021-02-06 | Disposition: A | Discharge status: Home / Self Care | Payer: Medicare PPO | Source: Ambulatory Visit | Attending: Urology | Admitting: Urology

## 2021-02-06 ENCOUNTER — Encounter (HOSPITAL_COMMUNITY): Payer: Self-pay | Admitting: Physician Assistant

## 2021-02-06 ENCOUNTER — Encounter (HOSPITAL_COMMUNITY): Payer: Self-pay

## 2021-02-06 DIAGNOSIS — N210 Calculus in bladder: Secondary | ICD-10-CM | POA: Insufficient documentation

## 2021-02-06 DIAGNOSIS — Z01812 Encounter for preprocedural laboratory examination: Secondary | ICD-10-CM | POA: Insufficient documentation

## 2021-02-06 HISTORY — DX: Calculus in bladder: N21.0

## 2021-02-06 HISTORY — DX: Sleep apnea, unspecified: G47.30

## 2021-02-06 HISTORY — DX: Cardiomyopathy, unspecified: I42.9

## 2021-02-06 LAB — BASIC METABOLIC PANEL
Anion gap: 8 (ref 5–15)
BUN: 21 mg/dL (ref 8–23)
CO2: 22 mmol/L (ref 22–32)
Calcium: 8.5 mg/dL — ABNORMAL LOW (ref 8.9–10.3)
Chloride: 101 mmol/L (ref 98–111)
Creatinine, Ser: 0.78 mg/dL (ref 0.61–1.24)
GFR, Estimated: >60 mL/min (ref >60)
Glucose, Bld: 109 mg/dL — ABNORMAL HIGH (ref 70–99)
Potassium: 4.4 mmol/L (ref 3.5–5.1)
Sodium: 131 mmol/L — ABNORMAL LOW (ref 135–145)

## 2021-02-06 LAB — CBC
HCT: 40.4 % (ref 39.0–52.0)
Hemoglobin: 13.5 g/dL (ref 13.0–17.0)
MCH: 31.9 pg (ref 26.0–34.0)
MCHC: 33.4 g/dL (ref 30.0–36.0)
MCV: 95.5 fL (ref 80.0–100.0)
Platelets: 160 10*3/uL (ref 150–400)
RBC: 4.23 MIL/uL (ref 4.22–5.81)
RDW: 12.6 % (ref 11.5–15.5)
WBC: 6 10*3/uL (ref 4.0–10.5)
nRBC: 0 % (ref 0.0–0.2)

## 2021-02-06 NOTE — Anesthesia Preprocedure Evaluation (Deleted)
Anesthesia Evaluation    Airway        Dental   Pulmonary former smoker,           Cardiovascular hypertension,      Neuro/Psych    GI/Hepatic   Endo/Other    Renal/GU      Musculoskeletal   Abdominal   Peds  Hematology   Anesthesia Other Findings   Reproductive/Obstetrics                             Anesthesia Physical Anesthesia Plan  ASA:   Anesthesia Plan:    Post-op Pain Management:    Induction:   PONV Risk Score and Plan:   Airway Management Planned:   Additional Equipment:   Intra-op Plan:   Post-operative Plan:   Informed Consent:   Plan Discussed with:   Anesthesia Plan Comments: (See PAT note 02/06/2021, Jodell Cipro Ward, PA-C)        Anesthesia Quick Evaluation

## 2021-02-06 NOTE — Progress Notes (Signed)
Anesthesia Chart Review   Case: [id number] Date/Time: 02/08/21 0715   Procedures:      CYSTOSCOPY WITH LITHOLAPAXY - 45 MINS     POSSIBLE HOLMIUM LASER APPLICATION     CYSTOSCOPY   Anesthesia type: General   Pre-op diagnosis: BLADDER CALCULI   Location: [facility] PROCED[facility] / [facility]   Surgeons: Marcine Matar, MD       DISCUSSION:80 y.o. former smoker with h/o HTN, LBBB, atrial fibrillation (on Eliquis), CHF, sleep apnea, bladder calculi scheduled for above procedure 02/08/2021 with Dr. Marcine Matar.   Pt last seen by cardiology 08/15/20. Stable at this visit.  Echo repeated 12/06/2020 with EF ~40%, no significant change.   Last dose of Eliquis 02/05/2021  Anticipate pt can proceed with planned procedure barring acute status change.   VS: BP (!) 141/88   Pulse (!) 50   Temp 36.8 C (Oral)   Resp 18   Ht 6' (1.829 m)   Wt 89.8 kg   SpO2 100%   BMI 26.85 kg/m   PROVIDERS: Soundra Pilon, FNP is PCP   Olga Millers, MD is Cardiologist  LABS: Labs reviewed: Acceptable for surgery. (all labs ordered are listed, but only abnormal results are displayed)  Labs Reviewed  BASIC METABOLIC PANEL - Abnormal; Notable for the following components:      Result Value   Sodium 131 (*)    Glucose, Bld 109 (*)    Calcium 8.5 (*)    All other components within normal limits  CBC     IMAGES:   EKG: 08/15/20 Rate 44 bpm  Marked sinus bradycardia  LBBB  CV: Echo 12/06/2020  1. Left ventricular ejection fraction, by estimation, is ~40%. The left  ventricle has mildly decreased function. The left ventricle demonstrates  global hypokinesis with septal-lateral dysynchrony due to LBBB. There is  mild concentric left ventricular  hypertrophy. Left ventricular diastolic parameters are consistent with  Grade II diastolic dysfunction (pseudonormalization).   2. Right ventricular systolic function is mildly reduced. The right  ventricular size is normal. There is normal pulmonary  artery systolic  pressure. The estimated right ventricular systolic pressure is 33.0 mmHg.   3. Left atrial size was severely dilated.   4. Right atrial size was mildly dilated.   5. The mitral valve is normal in structure. Trivial mitral valve  regurgitation.   6. The aortic valve is tricuspid. There is mild calcification of the  aortic valve. There is mild thickening of the aortic valve. Aortic valve  regurgitation is not visualized. Mild to moderate aortic valve  sclerosis/calcification is present, without any  evidence of aortic stenosis.   7. Aortic dilatation noted. There is mild dilatation of the aortic root,  measuring 42 mm. There is mild dilatation of the ascending aorta,  measuring 41 mm.   8. The inferior vena cava is normal in size with greater than 50%  respiratory variability, suggesting right atrial pressure of 3 mmHg.  Stress Test 09/22/2014 Overall Impression:  Low risk stress nuclear study With a moderate fixed perfusion defect in the inferoseptal wall that is most consistent with LBBB septal wall motion, but cannot exclude distal LAD (or rPDA) infarction.  . Past Medical History:  Diagnosis Date   Atrial fibrillation (HCC)    Bladder calculi    Cardiomyopathy (HCC)    Hyperlipidemia    Hypertension    Left bundle branch block    Nocturia    Osteoarthritis    Sleep apnea  Vertigo     Past Surgical History:  Procedure Laterality Date   CARDIOVERSION N/A 09/01/2019   Procedure: CARDIOVERSION;  Surgeon: Little Ishikawa, MD;  Location: [facility] ENDOSCOPY;  Service: Cardiovascular;  Laterality: N/A;   CARDIOVERSION N/A 11/08/2019   Procedure: CARDIOVERSION;  Surgeon: Sande Rives, MD;  Location: [facility] ENDOSCOPY;  Service: Cardiovascular;  Laterality: N/A;   HERNIA REPAIR     TOTAL HIP ARTHROPLASTY Bilateral     MEDICATIONS:  amiodarone (PACERONE) 200 MG tablet   amoxicillin (AMOXIL) 500 MG capsule   apixaban (ELIQUIS) 5 MG TABS tablet   atorvastatin  (LIPITOR) 80 MG tablet   carvedilol (COREG) 6.25 MG tablet   ezetimibe (ZETIA) 10 MG tablet   oxcarbazepine (TRILEPTAL) 600 MG tablet   sacubitril-valsartan (ENTRESTO) 97-103 MG   sildenafil (VIAGRA) 100 MG tablet   No current facility-administered medications for this encounter.   Jodell Cipro Ward, PA-C [facility] Pre-Surgical Testing [phone]

## 2021-02-07 ENCOUNTER — Ambulatory Visit: Payer: Medicare PPO | Admitting: Adult Health

## 2021-02-07 ENCOUNTER — Telehealth: Payer: Self-pay | Admitting: Adult Health

## 2021-02-07 ENCOUNTER — Ambulatory Visit: Payer: Medicare PPO | Admitting: Physician Assistant

## 2021-02-07 DIAGNOSIS — R42 Dizziness and giddiness: Secondary | ICD-10-CM | POA: Diagnosis not present

## 2021-02-07 NOTE — Telephone Encounter (Signed)
Wife states pt current duration of vertigo has lasted 23 hours, pt is lightheaded and has to hold to wife to walk.  Wife is asking for a call to discuss.

## 2021-02-10 NOTE — Progress Notes (Signed)
HPI: FU atrial fibrillation. Cardiac catheterization April 2003 showed normal coronary arteries and ejection fraction 45 to 50%.  Nuclear study April 2016 showed ejection fraction 66%, artifact suggestive of left bundle branch block but no ischemia. Patient previously seen by Dr. Clarene Duke with complaints of increased dyspnea on exertion.  Electrocardiogram showed atrial fibrillation.  Echocardiogram March 2021 showed ejection fraction 25 to 30%, mild RV dysfunction and mild right ventricular enlargement, severe biatrial enlargement, mild to moderate mitral regurgitation and tricuspid regurgitation and mildly dilated aortic root.  Abdominal ultrasound March 2021 showed no aneurysm. Patient had successful cardioversion on September 01, 2019. At time of follow-up he was back in atrial fibrillation.  We initiated amiodarone and he underwent cardioversion successfully June 2021. Cardiac CTA October 2021 showed calcium score 326, 50 to 69% mid LAD stenosis, 25 to 49% ramus intermedius stenosis and diminutive or possibly occluded left circumflex.  FFR 0.76 in the distal LAD suggestive of flow-limiting stenosis.  Echocardiogram July 2022 showed ejection fraction 40%, dyssynchrony due to left bundle branch block, grade 2 diastolic dysfunction, mild RV dysfunction, severe left atrial enlargement, mild right atrial enlargement and mildly dilated aortic root at 42 mm.  Since last seen pt denies DOE, CP, palpitations or syncope.  Current Outpatient Medications  Medication Sig Dispense Refill   amiodarone (PACERONE) 200 MG tablet TAKE 1 TABLET BY MOUTH DAILY. (Patient taking differently: Take 200 mg by mouth daily.) 90 tablet 3   amoxicillin (AMOXIL) 500 MG capsule Take 2,000 mg by mouth See admin instructions. 2000mg  once, one hour prior to dental appointment     apixaban (ELIQUIS) 5 MG TABS tablet TAKE 1 TABLET BY MOUTH TWICE DAILY. (Patient taking differently: Take 5 mg by mouth 2 (two) times daily.) 180 tablet 1    atorvastatin (LIPITOR) 80 MG tablet Take 80 mg by mouth daily.      carvedilol (COREG) 6.25 MG tablet Take 1 tablet (6.25 mg total) by mouth 2 (two) times daily with a meal. 180 tablet 3   ezetimibe (ZETIA) 10 MG tablet Take 1 tablet (10 mg total) by mouth daily. 90 tablet 3   oxcarbazepine (TRILEPTAL) 600 MG tablet Take 1 tablet (600 mg total) by mouth in the morning AND 1.5 tablets (900 mg total) at bedtime. 225 tablet 3   sacubitril-valsartan (ENTRESTO) 97-103 MG Take 1 tablet by mouth 2 (two) times daily. 180 tablet 3   sildenafil (VIAGRA) 100 MG tablet Take 100 mg by mouth daily as needed for erectile dysfunction.     No current facility-administered medications for this visit.     Past Medical History:  Diagnosis Date   Atrial fibrillation (HCC)    Bladder calculi    Cardiomyopathy (HCC)    Hyperlipidemia    Hypertension    Left bundle branch block    Nocturia    Osteoarthritis    Sleep apnea    Vertigo     Past Surgical History:  Procedure Laterality Date   CARDIOVERSION N/A 09/01/2019   Procedure: CARDIOVERSION;  Surgeon: 11/01/2019, MD;  Location: [facility] ENDOSCOPY;  Service: Cardiovascular;  Laterality: N/A;   CARDIOVERSION N/A 11/08/2019   Procedure: CARDIOVERSION;  Surgeon: 11/10/2019, MD;  Location: [facility] ENDOSCOPY;  Service: Cardiovascular;  Laterality: N/A;   HERNIA REPAIR     TOTAL HIP ARTHROPLASTY Bilateral     Social History   Socioeconomic History   Marital status: Married    Spouse name: Not on file   Number of  children: 3   Years of education: Not on file   Highest education level: Not on file  Occupational History   Not on file  Tobacco Use   Smoking status: Former   Smokeless tobacco: Never  Vaping Use   Vaping Use: Never used  Substance and Sexual Activity   Alcohol use: Yes    Alcohol/week: 1.0 standard drink    Types: 1 Glasses of wine per week    Comment: 1-2 glasses wine per day   Drug use: No   Sexual activity: Never     Birth control/protection: Abstinence  Other Topics Concern   Not on file  Social History Narrative   Not on file   Social Determinants of Health   Financial Resource Strain: Not on file  Food Insecurity: Not on file  Transportation Needs: Not on file  Physical Activity: Not on file  Stress: Not on file  Social Connections: Not on file  Intimate Partner Violence: Not on file    Family History  Problem Relation Age of Onset   COPD Mother    Stroke Mother    Hypertension Father     ROS: no fevers or chills, productive cough, hemoptysis, dysphasia, odynophagia, melena, hematochezia, dysuria, hematuria, rash, seizure activity, orthopnea, PND, pedal edema, claudication. Remaining systems are negative.  Physical Exam: Well-developed well-nourished in no acute distress.  Skin is warm and dry.  HEENT is normal.  Neck is supple.  Chest is clear to auscultation with normal expansion.  Cardiovascular exam is regular rate and rhythm.  Abdominal exam nontender or distended. No masses palpated. Extremities show no edema. neuro grossly intact   A/P  1 paroxysmal atrial fibrillation-patient is in sinus rhythm today.  Continue amiodarone and apixaban.  Check TSH and chest x-ray.  Recent liver functions normal.  2 cardiomyopathy-out of proportion to coronary disease.  We will continue Entresto and carvedilol.  3 coronary artery disease-continue statin.  No aspirin given need for apixaban.  4 hyperlipidemia-continue statin.  5 hypertension-patient's blood pressure is mildly elevated.  Add hydralazine 25 mg twice daily and increase as needed.  6 obstructive sleep apnea-Per Dr. Tresa Endo.  Olga Millers, MD

## 2021-02-13 ENCOUNTER — Other Ambulatory Visit: Payer: Self-pay

## 2021-02-13 ENCOUNTER — Ambulatory Visit: Payer: Medicare PPO | Admitting: Cardiology

## 2021-02-13 ENCOUNTER — Encounter: Payer: Self-pay | Admitting: Cardiology

## 2021-02-13 VITALS — BP 140/76 | HR 60 | Ht 72.0 in | Wt 200.8 lb

## 2021-02-13 DIAGNOSIS — E7800 Pure hypercholesterolemia, unspecified: Secondary | ICD-10-CM | POA: Diagnosis not present

## 2021-02-13 DIAGNOSIS — I480 Paroxysmal atrial fibrillation: Secondary | ICD-10-CM | POA: Diagnosis not present

## 2021-02-13 DIAGNOSIS — I2510 Atherosclerotic heart disease of native coronary artery without angina pectoris: Secondary | ICD-10-CM

## 2021-02-13 DIAGNOSIS — I10 Essential (primary) hypertension: Secondary | ICD-10-CM | POA: Diagnosis not present

## 2021-02-13 DIAGNOSIS — I420 Dilated cardiomyopathy: Secondary | ICD-10-CM | POA: Diagnosis not present

## 2021-02-13 MED ORDER — HYDRALAZINE HCL 25 MG PO TABS: 25.0000 mg | ORAL_TABLET | Freq: Two times a day (BID) | ORAL | 3 refills | Status: DC

## 2021-02-13 NOTE — Patient Instructions (Signed)
Medication Instructions:   START HYDRALAZINE 25 MG TWICE DAILY  *If you need a refill on your cardiac medications before your next appointment, please call your pharmacy*   Lab Work:  Your physician recommends that you return for lab work WHEN CONVENIENT  If you have labs (blood work) drawn today and your tests are completely normal, you will receive your results only by: MyChart Message (if you have MyChart) OR A paper copy in the mail If you have any lab test that is abnormal or we need to change your treatment, we will call you to review the results.   Testing/Procedures:  A chest x-ray takes a picture of the organs and structures inside the chest, including the heart, lungs, and blood vessels. This test can show several things, including, whether the heart is enlarges; whether fluid is building up in the lungs; and whether pacemaker / defibrillator leads are still in place. [place] [facility]=[id number]   Follow-Up: At [facility], you and your health needs are our priority.  As part of our continuing mission to provide you with exceptional heart care, we have created designated Provider Care Teams.  These Care Teams include your primary Cardiologist (physician) and Advanced Practice Providers (APPs -  Physician Assistants and Nurse Practitioners) who all work together to provide you with the care you need, when you need it.  We recommend signing up for the patient portal called "MyChart".  Sign up information is provided on this After Visit Summary.  MyChart is used to connect with patients for Virtual Visits (Telemedicine).  Patients are able to view lab/test results, encounter notes, upcoming appointments, etc.  Non-urgent messages can be sent to your provider as well.   To learn more about what you can do with MyChart, go to [email or web address].    Your next appointment:   6 month(s)  The format for your next appointment:   In Person  Provider:   Olga Millers, MD

## 2021-02-19 ENCOUNTER — Ambulatory Visit (HOSPITAL_COMMUNITY): Admission: RE | Admit: 2021-02-19 | Payer: Medicare PPO | Source: Home / Self Care | Admitting: Urology

## 2021-02-19 SURGERY — CYSTOSCOPY, WITH BLADDER CALCULUS LITHOLAPAXY
Anesthesia: General

## 2021-02-22 ENCOUNTER — Ambulatory Visit: Payer: Medicare PPO | Admitting: Physician Assistant

## 2021-02-23 ENCOUNTER — Telehealth: Payer: Self-pay | Admitting: Cardiology

## 2021-02-23 MED ORDER — AMIODARONE HCL 200 MG PO TABS: 200.0000 mg | ORAL_TABLET | Freq: Every day | ORAL | 0 refills | Status: DC

## 2021-02-23 MED ORDER — ATORVASTATIN CALCIUM 80 MG PO TABS: 80.0000 mg | ORAL_TABLET | Freq: Every day | ORAL | 0 refills | Status: DC

## 2021-02-23 NOTE — Telephone Encounter (Signed)
The patient has been made aware that a 3 day supply has been sent into the pharmacy requested.

## 2021-02-23 NOTE — Telephone Encounter (Signed)
*  STAT* If patient is at the pharmacy, call can be transferred to refill team.   1. Which medications need to be refilled? (please list name of each medication and dose if known) amiodarone (PACERONE) 200 MG tablet   atorvastatin (LIPITOR) 80 MG tablet   2. Which pharmacy/location (including street and city if local pharmacy) is medication to be sent to? [id number] - [place], [place] - [address] AT [address], [address]  3. Do they need a 30 day or 90 day supply? 3 day supply   Pt is needing a few days worth of meds.. [place] and forgot these two medications at home... please advise

## 2021-02-27 ENCOUNTER — Ambulatory Visit: Payer: Self-pay

## 2021-02-27 ENCOUNTER — Other Ambulatory Visit: Payer: Self-pay

## 2021-02-27 ENCOUNTER — Encounter: Payer: Self-pay | Admitting: Orthopaedic Surgery

## 2021-02-27 ENCOUNTER — Ambulatory Visit (INDEPENDENT_AMBULATORY_CARE_PROVIDER_SITE_OTHER): Payer: Medicare PPO | Admitting: Orthopaedic Surgery

## 2021-02-27 VITALS — Ht 71.0 in | Wt 205.2 lb

## 2021-02-27 DIAGNOSIS — Z96642 Presence of left artificial hip joint: Secondary | ICD-10-CM

## 2021-02-27 DIAGNOSIS — T84060A Wear of articular bearing surface of internal prosthetic right hip joint, initial encounter: Secondary | ICD-10-CM

## 2021-02-27 NOTE — Progress Notes (Signed)
Office Visit Note   Patient: Daniel Cain           Date of Birth: [birth year]           MRN: [id number] Visit Date: 02/27/2021              Requested by: Catha Gosselin, MD [address] [place],  [place] [address] PCP: Soundra Pilon, FNP   Assessment & Plan: Visit Diagnoses:  1. History of left hip replacement   2. 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  The biggest concern is the possibility that he needs a full acetabular revision given the extensive osteolysis that is seen and bone loss.  This is something that I do not have experience with in terms of an extensive acetabular revision.  I have actually performed polyliner exchanges and hip ball exchange is through an anterior approach on patients that have probably wear on total hips that were done through a posterior approach and have been successful with just performing a liner and hip ball exchange.  My concern is that the degree of osteolysis that he has in his pelvis and he may likely need to complete acetabular revision with bone grafting and a more significant prosthesis that I have not had any experience with.  I would like to make referral to a tertiary care center to Department of orthopedics joint reconstruction specialist to get their opinion and evaluation of this issue.  We will send him to [facility] orthopedics over [place].  He agrees with this referral as well.  All question concerns were answered and addressed.  Follow-Up Instructions: No follow-ups on file.   Orders:  Orders Placed This Encounter  Procedures   XR HIP UNILAT W OR W/O PELVIS 2-3 VIEWS LEFT   No orders of the defined types were placed in this encounter.     Procedures: No procedures performed   Clinical Data: No additional findings.   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  He is someone who had a left total hip arthroplasty done through a posterior approach in 1997 by a surgeon who is since retired.  I should replaced his right hip through a direct anterior approach in 2012.  He denies pain with either hip.  He is 80 years old.  He does have cardiac issues with his last echocardiogram showing an EF of 40%.  He is on Eliquis as well.  He was sent to me after having a CT scan of his abdomen pelvis by urology as they were working up other issues.  That CT scan showed worrisome findings around the acetabulum with osteolysis suggesting polyliner wear.  I was able to see x-rays of his pelvis from 2012 showing significant polyliner wear at that time of the left hip.  After his hip replaced with the right side he did not come back to see [place] for a long period time since he was doing so well he states.  He denies any other illnesses and again denies any left hip pain.  Recent labs showed a normal white blood cell count peripherally.  HPI  Review of Systems Today he denies any headache, chest pain,  shortness of breath, fever, chills, nausea, vomiting  Objective: Vital Signs: Ht 5\' 11"  (1.803 m)   Wt 205 lb 3.2 oz (93.1 kg)   BMI 28.62 kg/m   Physical Exam He is alert and orient x3 and in no acute distress.  He does not walk with a limp or is using an assistive device. Ortho Exam Both hips move smoothly and fluidly with no pain.  There is a slight leg length discrepancy with the left side shorter than the right. Specialty Comments:  No specialty comments available.  Imaging: XR HIP UNILAT W OR W/O PELVIS 2-3 VIEWS LEFT  Result Date: 02/27/2021 An AP pelvis and lateral left hip are obtained today and compared to previous films from 2012.  There is extensive polyliner wear on the left side with the femoral head  in a more superior position.  There is lytic changes around the acetabulum and ischium.  The femoral component appears to be well-seated as is the acetabular component.   I did independently review the CT scan of his pelvis.  There is significant osteolysis around the acetabulum and ischium on the left side.  There is no evidence of an abscess.  There is breakthrough of the inner table of the pelvis.  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   Essential hypertension 08/30/2014   Dizziness 08/30/2014   Hyperlipidemia 08/30/2014   Past Medical History:  Diagnosis Date   Atrial fibrillation (HCC)    Bladder calculi    Cardiomyopathy (HCC)    Hyperlipidemia    Hypertension    Left bundle branch block    Nocturia    Osteoarthritis    Sleep apnea    Vertigo     Family History  Problem Relation Age of Onset   COPD Mother    Stroke Mother    Hypertension Father     Past Surgical History:  Procedure Laterality Date   CARDIOVERSION N/A 09/01/2019   Procedure: CARDIOVERSION;  Surgeon: 11/01/2019, MD;  Location: [facility] ENDOSCOPY;  Service: Cardiovascular;  Laterality: N/A;   CARDIOVERSION N/A 11/08/2019   Procedure: CARDIOVERSION;  Surgeon: 11/10/2019, MD;  Location: [facility] ENDOSCOPY;  Service: Cardiovascular;  Laterality: N/A;   HERNIA REPAIR     TOTAL HIP ARTHROPLASTY Bilateral    Social History   Occupational History   Not on file  Tobacco Use   Smoking status: Former   Smokeless tobacco: Never  Vaping Use   Vaping Use: Never used  Substance and Sexual Activity   Alcohol use: Yes    Alcohol/week: 1.0 standard drink    Types: 1 Glasses of wine per week    Comment: 1-2 glasses wine per day   Drug use: No   Sexual activity: Never    Birth control/protection: Abstinence

## 2021-03-02 NOTE — Progress Notes (Signed)
Spoke to patient and gave him new arrival time of 0715 for procedure scheduled for 03-08-21 and to become NPO at 6:30 AM.  Patient states that new medicine Hydralazine was added and he was advised that he could take that morning of surgery.  No change in medical history and states that vertigo has resolved.  Patient was unsure if he is to stop Eliquis for procedure.  Left message with Selita at Dr. Lenoria Chime office to see if he needed to discontinue.  Patient was advised that he would be called back regarding Eliquis.

## 2021-03-02 NOTE — Progress Notes (Signed)
Received message from [facility] at Dr. Lenoria Chime office that patient was advised to hold Eliquis 48 hours prior to surgery.  I also phoned and notified patient of this.

## 2021-03-05 DIAGNOSIS — R351 Nocturia: Secondary | ICD-10-CM | POA: Diagnosis not present

## 2021-03-05 DIAGNOSIS — N401 Enlarged prostate with lower urinary tract symptoms: Secondary | ICD-10-CM | POA: Diagnosis not present

## 2021-03-05 DIAGNOSIS — N210 Calculus in bladder: Secondary | ICD-10-CM | POA: Diagnosis not present

## 2021-03-07 NOTE — Anesthesia Preprocedure Evaluation (Addendum)
Anesthesia Evaluation  Patient identified by MRN, date of birth, ID band Patient awake    Reviewed: Allergy & Precautions, NPO status , Patient's Chart, lab work & pertinent test results  History of Anesthesia Complications Negative for: history of anesthetic complications  Airway Mallampati: I  TM Distance: >3 FB Neck ROM: Full    Dental no notable dental hx. (+) Teeth Intact, Dental Advisory Given   Pulmonary neg pulmonary ROS, former smoker,    Pulmonary exam normal        Cardiovascular hypertension, Pt. on medications Normal cardiovascular exam+ dysrhythmias (on amiodarone and eliquis) Atrial Fibrillation   IMPRESSIONS   1. Left ventricular ejection fraction, by estimation, is ~40%. The left ventricle has mildly decreased function. The left ventricle demonstrates global hypokinesis with septal-lateral dysynchrony due to LBBB. There is mild concentric left ventricular  hypertrophy. Left ventricular diastolic parameters are consistent with Grade II diastolic dysfunction (pseudonormalization). 2. Right ventricular systolic function is mildly reduced. The right ventricular size is normal. There is normal pulmonary artery systolic pressure. The estimated right ventricular systolic pressure is 33.0 mmHg. 3. Left atrial size was severely dilated. 4. Right atrial size was mildly dilated. 5. The mitral valve is normal in structure. Trivial mitral valve regurgitation. 6. The aortic valve is tricuspid. There is mild calcification of the aortic valve. There is mild thickening of the aortic valve. Aortic valve regurgitation is not visualized. Mild to moderate aortic valve sclerosis/calcification is present, without any  evidence of aortic stenosis. 7. Aortic dilatation noted. There is mild dilatation of the aortic root, measuring 42 mm. There is mild dilatation of the ascending aorta, measuring 41 mm. 8. The inferior vena cava is  normal in size with greater than 50% respiratory variability, suggesting right atrial pressure of 3 mmHg.  Comparison(s): Compared to prior TTE in 01/2020, the LVEF appears very slightly improved to ~40% (previously 35-40%). Otherwise there is no significant change.    Neuro/Psych negative neurological ROS  negative psych ROS   GI/Hepatic negative GI ROS, Neg liver ROS,   Endo/Other  negative endocrine ROS  Renal/GU negative Renal ROS  negative genitourinary   Musculoskeletal  (+) Arthritis ,   Abdominal   Peds  Hematology negative hematology ROS (+)   Anesthesia Other Findings   Reproductive/Obstetrics                            Anesthesia Physical  Anesthesia Plan  ASA: 3  Anesthesia Plan: General   Post-op Pain Management:    Induction: Intravenous  PONV Risk Score and Plan: 2 and Dexamethasone and Ondansetron  Airway Management Planned: LMA  Additional Equipment:   Intra-op Plan:   Post-operative Plan: Extubation in OR  Informed Consent: I have reviewed the patients History and Physical, chart, labs and discussed the procedure including the risks, benefits and alternatives for the proposed anesthesia with the patient or authorized representative who has indicated his/her understanding and acceptance.       Plan Discussed with: Anesthesiologist and CRNA  Anesthesia Plan Comments:        Anesthesia Quick Evaluation

## 2021-03-08 ENCOUNTER — Ambulatory Visit (HOSPITAL_COMMUNITY)
Admission: RE | Admit: 2021-03-08 | Discharge: 2021-03-08 | Disposition: A | Discharge status: Home / Self Care | Payer: Medicare PPO | Attending: Urology | Admitting: Urology

## 2021-03-08 ENCOUNTER — Ambulatory Visit (HOSPITAL_COMMUNITY): Payer: Medicare PPO | Admitting: Physician Assistant

## 2021-03-08 ENCOUNTER — Encounter (HOSPITAL_COMMUNITY): Payer: Self-pay | Admitting: Urology

## 2021-03-08 ENCOUNTER — Encounter (HOSPITAL_COMMUNITY)
Admission: RE | Disposition: A | Discharge status: Home / Self Care | Payer: Self-pay | Source: Home / Self Care | Attending: Urology

## 2021-03-08 DIAGNOSIS — E785 Hyperlipidemia, unspecified: Secondary | ICD-10-CM | POA: Diagnosis not present

## 2021-03-08 DIAGNOSIS — I4891 Unspecified atrial fibrillation: Secondary | ICD-10-CM | POA: Insufficient documentation

## 2021-03-08 DIAGNOSIS — Z87891 Personal history of nicotine dependence: Secondary | ICD-10-CM | POA: Insufficient documentation

## 2021-03-08 DIAGNOSIS — N401 Enlarged prostate with lower urinary tract symptoms: Secondary | ICD-10-CM | POA: Diagnosis not present

## 2021-03-08 DIAGNOSIS — I10 Essential (primary) hypertension: Secondary | ICD-10-CM | POA: Diagnosis not present

## 2021-03-08 DIAGNOSIS — G4730 Sleep apnea, unspecified: Secondary | ICD-10-CM | POA: Insufficient documentation

## 2021-03-08 DIAGNOSIS — I447 Left bundle-branch block, unspecified: Secondary | ICD-10-CM | POA: Diagnosis not present

## 2021-03-08 DIAGNOSIS — N138 Other obstructive and reflux uropathy: Secondary | ICD-10-CM | POA: Insufficient documentation

## 2021-03-08 DIAGNOSIS — N400 Enlarged prostate without lower urinary tract symptoms: Secondary | ICD-10-CM | POA: Diagnosis not present

## 2021-03-08 DIAGNOSIS — I429 Cardiomyopathy, unspecified: Secondary | ICD-10-CM | POA: Diagnosis not present

## 2021-03-08 DIAGNOSIS — N210 Calculus in bladder: Secondary | ICD-10-CM | POA: Insufficient documentation

## 2021-03-08 HISTORY — PX: TRANSURETHRAL INCISION OF PROSTATE: SHX2573

## 2021-03-08 HISTORY — PX: CYSTOSCOPY WITH LITHOLAPAXY: SHX1425

## 2021-03-08 SURGERY — CYSTOSCOPY, WITH BLADDER CALCULUS LITHOLAPAXY
Anesthesia: General | Site: Bladder

## 2021-03-08 MED ORDER — ONDANSETRON HCL 4 MG/2ML IJ SOLN
INTRAMUSCULAR | Status: AC
  Filled 2021-03-08: qty 2

## 2021-03-08 MED ORDER — ONDANSETRON HCL 4 MG/2ML IJ SOLN
INTRAMUSCULAR | Status: DC | PRN
  Administered 2021-03-08: 4 mg via INTRAVENOUS

## 2021-03-08 MED ORDER — SODIUM CHLORIDE 0.9 % IR SOLN
Status: DC | PRN
  Administered 2021-03-08: 1000 mL
  Administered 2021-03-08: 12000 mL

## 2021-03-08 MED ORDER — SULFAMETHOXAZOLE-TRIMETHOPRIM 800-160 MG PO TABS: 1.0000 | ORAL_TABLET | Freq: Two times a day (BID) | ORAL | 0 refills | Status: DC

## 2021-03-08 MED ORDER — LIDOCAINE 2% (20 MG/ML) 5 ML SYRINGE
INTRAMUSCULAR | Status: DC | PRN
  Administered 2021-03-08: 100 mg via INTRAVENOUS

## 2021-03-08 MED ORDER — CHLORHEXIDINE GLUCONATE 0.12 % MT SOLN
15.0000 mL | Freq: Once | OROMUCOSAL | Status: AC
  Administered 2021-03-08: 15 mL via OROMUCOSAL

## 2021-03-08 MED ORDER — EPHEDRINE SULFATE-NACL 50-0.9 MG/10ML-% IV SOSY
PREFILLED_SYRINGE | INTRAVENOUS | Status: DC | PRN
  Administered 2021-03-08: 10 mg via INTRAVENOUS
  Administered 2021-03-08: 5 mg via INTRAVENOUS
  Administered 2021-03-08: 15 mg via INTRAVENOUS
  Administered 2021-03-08 (×2): 5 mg via INTRAVENOUS

## 2021-03-08 MED ORDER — EPHEDRINE 5 MG/ML INJ
INTRAVENOUS | Status: AC
  Filled 2021-03-08: qty 5

## 2021-03-08 MED ORDER — DEXAMETHASONE SODIUM PHOSPHATE 10 MG/ML IJ SOLN
INTRAMUSCULAR | Status: DC | PRN
  Administered 2021-03-08: 4 mg via INTRAVENOUS

## 2021-03-08 MED ORDER — CELECOXIB 200 MG PO CAPS
200.0000 mg | ORAL_CAPSULE | Freq: Once | ORAL | Status: AC
  Administered 2021-03-08: 200 mg via ORAL
  Filled 2021-03-08: qty 1

## 2021-03-08 MED ORDER — ACETAMINOPHEN 500 MG PO TABS
1000.0000 mg | ORAL_TABLET | Freq: Once | ORAL | Status: AC
  Administered 2021-03-08: 1000 mg via ORAL
  Filled 2021-03-08: qty 2

## 2021-03-08 MED ORDER — PROPOFOL 10 MG/ML IV BOLUS
INTRAVENOUS | Status: AC
  Filled 2021-03-08: qty 20

## 2021-03-08 MED ORDER — PROPOFOL 10 MG/ML IV BOLUS
INTRAVENOUS | Status: DC | PRN
Start: 2021-03-08 — End: 2021-03-08
  Administered 2021-03-08: 40 mg via INTRAVENOUS
  Administered 2021-03-08: 160 mg via INTRAVENOUS

## 2021-03-08 MED ORDER — DEXAMETHASONE SODIUM PHOSPHATE 10 MG/ML IJ SOLN
INTRAMUSCULAR | Status: AC
  Filled 2021-03-08: qty 1

## 2021-03-08 MED ORDER — LACTATED RINGERS IV SOLN: INTRAVENOUS | Status: DC

## 2021-03-08 MED ORDER — CEFAZOLIN SODIUM-DEXTROSE 2-4 GM/100ML-% IV SOLN
2.0000 g | INTRAVENOUS | Status: AC
  Administered 2021-03-08: 2 g via INTRAVENOUS
  Filled 2021-03-08: qty 100

## 2021-03-08 SURGICAL SUPPLY — 24 items
BAG COUNTER SPONGE SURGICOUNT (BAG) IMPLANT
BAG URINE DRAIN 2000ML AR STRL (UROLOGICAL SUPPLIES) ×3 IMPLANT
BAG URO CATCHER STRL LF (MISCELLANEOUS) ×3 IMPLANT
CATH FOLEY 2WAY SLVR  5CC 20FR (CATHETERS) ×3
CATH FOLEY 2WAY SLVR 5CC 20FR (CATHETERS) ×2 IMPLANT
CLOTH BEACON ORANGE TIMEOUT ST (SAFETY) ×3 IMPLANT
COVER SURGICAL LIGHT HANDLE (MISCELLANEOUS) ×3 IMPLANT
GLOVE SURG ENC TEXT LTX SZ8 (GLOVE) ×3 IMPLANT
GOWN STRL REUS W/TWL XL LVL3 (GOWN DISPOSABLE) ×3 IMPLANT
KIT PROBE 340X3.4XDISP GRN (MISCELLANEOUS) IMPLANT
KIT PROBE TRILOGY 3.4X340 (MISCELLANEOUS)
KIT PROBE TRILOGY 3.9X350 (MISCELLANEOUS) IMPLANT
KIT TURNOVER KIT A (KITS) ×3 IMPLANT
LASER FIB FLEXIVA PULSE ID 550 (Laser) IMPLANT
LASER FIB FLEXIVA PULSE ID 910 (Laser) IMPLANT
LOOP CUT BIPOLAR 24F LRG (ELECTROSURGICAL) ×3 IMPLANT
MANIFOLD NEPTUNE II (INSTRUMENTS) ×3 IMPLANT
PACK CYSTO (CUSTOM PROCEDURE TRAY) ×3 IMPLANT
PENCIL SMOKE EVACUATOR (MISCELLANEOUS) IMPLANT
SYR TOOMEY IRRIG 70ML (MISCELLANEOUS)
SYRINGE TOOMEY IRRIG 70ML (MISCELLANEOUS) IMPLANT
TUBING CONNECTING 10 (TUBING) IMPLANT
TUBING STONE CATCHER TRILOGY (MISCELLANEOUS) IMPLANT
TUBING UROLOGY SET (TUBING) ×3 IMPLANT

## 2021-03-08 NOTE — Discharge Instructions (Signed)
You may see some blood in the urine and may have some burning with urination for 48-72 hours. You also may notice that you have to urinate more frequently or urgently after your procedure which is normal.  You should call should you develop an inability urinate, fever > 101, persistent nausea and vomiting that prevents you from eating or drinking to stay hydrated.  If you have a catheter, you will be taught how to take care of the catheter by the nursing staff prior to discharge from the hospital.  You may periodically feel a strong urge to void with the catheter in place.  This is a bladder spasm and most often can occur when having a bowel movement or moving around. It is typically self-limited and usually will stop after a few minutes.  You may use some Vaseline or Neosporin around the tip of the catheter to reduce friction at the tip of the penis. You may also see some blood in the urine.  A very small amount of blood can make the urine look quite red.  As long as the catheter is draining well, there usually is not a problem.  However, if the catheter is not draining well and is bloody, you should call the office [phone]) to notify us.  If urine is fairly clear by Friday morning, it is okay to remove the catheter then as directed by the nurses.

## 2021-03-08 NOTE — H&P (Signed)
H&P  Chief Complaint:  Bladder stones History of Present Illness: 80 yo male presents for cystolithalopaxy for mgmt of multiple bladder calculi. I have also discussed possible TURP/TUIP.  Past Medical History:  Diagnosis Date   Atrial fibrillation (HCC)    Bladder calculi    Cardiomyopathy (HCC)    Hyperlipidemia    Hypertension    Left bundle branch block    Nocturia    Osteoarthritis    Sleep apnea    Vertigo     Past Surgical History:  Procedure Laterality Date   CARDIOVERSION N/A 09/01/2019   Procedure: CARDIOVERSION;  Surgeon: Little Ishikawa, MD;  Location: [facility] ENDOSCOPY;  Service: Cardiovascular;  Laterality: N/A;   CARDIOVERSION N/A 11/08/2019   Procedure: CARDIOVERSION;  Surgeon: Sande Rives, MD;  Location: [facility] ENDOSCOPY;  Service: Cardiovascular;  Laterality: N/A;   HERNIA REPAIR     TOTAL HIP ARTHROPLASTY Bilateral     Home Medications:  Allergies as of 03/08/2021   No Known Allergies      Medication List      Notice   Cannot display discharge medications because the patient has not yet been admitted.     Allergies: No Known Allergies  Family History  Problem Relation Age of Onset   COPD Mother    Stroke Mother    Hypertension Father     Social History:  reports that he has quit smoking. He has never used smokeless tobacco. He reports current alcohol use of about 1.0 standard drink per week. He reports that he does not use drugs.  ROS: A complete review of systems was performed.  All systems are negative except for pertinent findings as noted.  Physical Exam:  Vital signs in last 24 hours: There were no vitals taken for this visit. Constitutional:  Alert and oriented, No acute distress Cardiovascular: Regular rate  Respiratory: Normal respiratory effort GI: Abdomen is soft, nontender, nondistended, no abdominal masses. No CVAT.  Genitourinary: Normal male phallus, testes are descended bilaterally and non-tender and without  masses, scrotum is normal in appearance without lesions or masses, perineum is normal on inspection. Lymphatic: No lymphadenopathy Neurologic: Grossly intact, no focal deficits Psychiatric: Normal mood and affect    Impression/Assessment:  Bladder calculi  Plan:  Cystolithalopaxy

## 2021-03-08 NOTE — Transfer of Care (Signed)
Immediate Anesthesia Transfer of Care Note  Patient: Daniel Cain  Procedure(s) Performed: CYSTOSCOPY WITH LITHOLAPAXY (Bladder) TRANSURETHRAL INCISION OF THE PROSTATE (TUIP) (Bladder)  Patient Location: PACU  Anesthesia Type:General  Level of Consciousness: awake  Airway & Oxygen Therapy: Patient Spontanous Breathing  Post-op Assessment: Report given to RN and Post -op Vital signs reviewed and stable  Post vital signs: Reviewed and stable  Last Vitals:  Vitals Value Taken Time  BP 128/79 03/08/21 1050  Temp 36.5 C 03/08/21 1050  Pulse 48 03/08/21 1054  Resp 15 03/08/21 1054  SpO2 100 % 03/08/21 1054  Vitals shown include unvalidated device data.  Last Pain:  Vitals:   03/08/21 0747  TempSrc:   PainSc: 0-No pain         Complications: No notable events documented.

## 2021-03-08 NOTE — Interval H&P Note (Signed)
History and Physical Interval Note:  03/08/2021 9:06 AM  Daniel Cain  has presented today for surgery, with the diagnosis of BLADDER CALCULI.  The various methods of treatment have been discussed with the patient and family. After consideration of risks, benefits and other options for treatment, the patient has consented to  Procedure(s): CYSTOSCOPY WITH LITHOLAPAXY (N/A) HOLMIUM LASER APPLICATION (N/A) as a surgical intervention.  The patient's history has been reviewed, patient examined, no change in status, stable for surgery.  I have reviewed the patient's chart and labs.  Questions were answered to the patient's satisfaction.     Bertram Millard Erionna Strum

## 2021-03-08 NOTE — Anesthesia Procedure Notes (Signed)
Procedure Name: LMA Insertion Date/Time: 03/08/2021 9:40 AM Performed by: Ezekiel Ina, CRNA Pre-anesthesia Checklist: Patient identified, Emergency Drugs available, Suction available and Patient being monitored Patient Re-evaluated:Patient Re-evaluated prior to induction Oxygen Delivery Method: Circle system utilized Preoxygenation: Pre-oxygenation with 100% oxygen Induction Type: IV induction Ventilation: Mask ventilation without difficulty LMA: LMA inserted LMA Size: 5.0 Number of attempts: 1 Tube secured with: Tape Dental Injury: Teeth and Oropharynx as per pre-operative assessment

## 2021-03-08 NOTE — Op Note (Signed)
Preoperative diagnosis: Multiple bladder calculi, largest 1 cm in size, BPH with obstruction  Postoperative diagnosis: Same  Principal procedure: Cystolitholapaxy, with large stone 1 cm in size, transurethral incision of prostate  Surgeon: Semya Klinke  Anesthesia: General with LMA  Specimens: Stones, prostate chips  Estimated blood loss: Less than 50 mL  Drains: 20 French Foley catheter to dependent drainage  Complications: None  Indications: 80 year old male with lower urinary tract symptoms.  He presented recently with gross hematuria.  Hematuria protocol CT scan revealed a thick-walled bladder, prostatomegaly, multiple bladder calculi.  Cystoscopic evaluation revealed multiple tiny stones and quite a few larger stones under 1 cm in size.  He had multiple trabeculations/cellules.  We discussed cystolitholapaxy as well as possible procedure for his prostate to improve his lower urinary tract symptoms and allow for drainage of any remaining stones.  I discussed the procedure, risk, complications as well as alternatives.  We discussed the probable outpatient nature of the procedure.  He desires to proceed.  Findings: Prostate was obstructive, there was a large median lobe with intravesical protrusion.  Urothelium of the bladder was normal.  Multiple trabeculations and cellules noted.  Ureteral orifices normal in their location and configuration.  There were multiple calculi, most of these were approximately 1 mm in size or smaller but there were probably 30 larger ones.  Description of procedure: The patient was properly identified in the holding area.  Is taken the operating room where general anesthetic was administered with the LMA.  He was placed in the dorsolithotomy position.  Genitalia and perineum were prepped, draped, proper timeout was performed.   26 French resectoscope sheath was placed using the visual obturator.  Circumferential inspection was performed with the above-mentioned  findings noted.  First, stones were rinsed out by direct approximation of the resectoscope sheath, easily draining most of the stones.  The larger ones were negotiated through the scope as well.  Any remaining stone material was rinsed out using the Ludden syringe.  A large number of small and larger stones were removed this way.  There was no need for laser lithotripsy.  Following copious irrigation and continued approximation of the sheath with the stones, all of the stone burden that I could see was removed.  Care was taken to adder each cellule to make sure that they were properly drained of the stones.  Following this, inspection of the prostatic urethra revealed an obstructive median lobe.  I felt that a TUIP would be adequate for improving the patient's drainage as well as improving his lower urinary tract symptoms.  Cutting loop was placed.  I then resected the entire median lobe from the bladder neck down to the verumontanum.  This created a nice open channel although there was mild coaptation of the lateral lobes I did not think that these needed to be resected.  Careful cautery applied to the incision site.  Following this, inspection with the irrigation off revealed no significant bleeding.  The chips were irrigated from the bladder and sent to pathology labeled "prostate chips".  Reinspection of all of cellules revealed no further stone material.  Reinspection of the prostatic fossa revealed hemostatic resected site.  The scope was then removed.  I then placed a 20 French Foley catheter.  Balloon filled with 20 cc of water, irrigated clear, and then hooked to dependent drainage.  This point, the procedure was terminated.  The patient was awakened, taken to the PACU in stable condition, having tolerated the procedure well.

## 2021-03-08 NOTE — Anesthesia Postprocedure Evaluation (Signed)
Anesthesia Post Note  Patient: Daniel Cain  Procedure(s) Performed: CYSTOSCOPY WITH LITHOLAPAXY (Bladder) TRANSURETHRAL INCISION OF THE PROSTATE (TUIP) (Bladder)     Patient location during evaluation: PACU Anesthesia Type: General Level of consciousness: sedated Pain management: pain level controlled Vital Signs Assessment: post-procedure vital signs reviewed and stable Respiratory status: spontaneous breathing and respiratory function stable Cardiovascular status: stable Postop Assessment: no apparent nausea or vomiting Anesthetic complications: no   No notable events documented.  Last Vitals:  Vitals:   03/08/21 1145 03/08/21 1200  BP: 133/81 (!) 145/82  Pulse: (!) 43 (!) 44  Resp: 14 12  Temp:    SpO2: 100% 100%    Last Pain:  Vitals:   03/08/21 1130  TempSrc:   PainSc: 0-No pain                 Emry Barbato DANIEL

## 2021-03-09 ENCOUNTER — Encounter (HOSPITAL_COMMUNITY): Payer: Self-pay | Admitting: Urology

## 2021-03-10 ENCOUNTER — Other Ambulatory Visit: Payer: Self-pay | Admitting: Student

## 2021-03-10 ENCOUNTER — Telehealth: Payer: Self-pay | Admitting: Student

## 2021-03-10 DIAGNOSIS — I420 Dilated cardiomyopathy: Secondary | ICD-10-CM

## 2021-03-10 DIAGNOSIS — R319 Hematuria, unspecified: Secondary | ICD-10-CM | POA: Diagnosis not present

## 2021-03-10 DIAGNOSIS — E7800 Pure hypercholesterolemia, unspecified: Secondary | ICD-10-CM

## 2021-03-10 DIAGNOSIS — N401 Enlarged prostate with lower urinary tract symptoms: Secondary | ICD-10-CM | POA: Diagnosis not present

## 2021-03-10 DIAGNOSIS — H8190 Unspecified disorder of vestibular function, unspecified ear: Secondary | ICD-10-CM

## 2021-03-10 DIAGNOSIS — R338 Other retention of urine: Secondary | ICD-10-CM | POA: Diagnosis not present

## 2021-03-10 MED ORDER — ATORVASTATIN CALCIUM 80 MG PO TABS: 80.0000 mg | ORAL_TABLET | Freq: Every day | ORAL | 0 refills | Status: AC

## 2021-03-10 MED ORDER — EZETIMIBE 10 MG PO TABS: 10.0000 mg | ORAL_TABLET | Freq: Every day | ORAL | 0 refills | Status: DC

## 2021-03-10 MED ORDER — OXCARBAZEPINE 600 MG PO TABS: ORAL_TABLET | ORAL | 0 refills | Status: DC

## 2021-03-10 MED ORDER — HYDRALAZINE HCL 25 MG PO TABS: 25.0000 mg | ORAL_TABLET | Freq: Two times a day (BID) | ORAL | 0 refills | Status: DC

## 2021-03-10 MED ORDER — AMIODARONE HCL 200 MG PO TABS: 200.0000 mg | ORAL_TABLET | Freq: Every day | ORAL | 0 refills | Status: DC

## 2021-03-10 MED ORDER — ENTRESTO 97-103 MG PO TABS: 1.0000 | ORAL_TABLET | Freq: Two times a day (BID) | ORAL | 0 refills | Status: AC

## 2021-03-10 MED ORDER — CARVEDILOL 6.25 MG PO TABS: 6.2500 mg | ORAL_TABLET | Freq: Two times a day (BID) | ORAL | 0 refills | Status: DC

## 2021-03-10 NOTE — Progress Notes (Signed)
    The patient's wife called the after-hours line reporting they are currently in [place] and the patient forgot to bring his medications with him. They will be there until Monday and refills of his cardiac medications through Monday were sent to the requested pharmacy. He is on Oxcarbazepine and a 3-day supply of this was sent and they are aware if further refills are needed, they will need to come from Neurology. He is on a medication for his prostate per Urology but this is not listed in Epic. His wife plans to follow-up with his Urologist to see if this can be sent in.  Signed, Ellsworth Lennox, PA-C 03/10/2021, 5:21 PM Pager: [phone]

## 2021-03-10 NOTE — Telephone Encounter (Signed)
Duplicate

## 2021-03-11 LAB — SURGICAL PATHOLOGY

## 2021-03-15 ENCOUNTER — Ambulatory Visit: Payer: Medicare PPO | Admitting: Adult Health

## 2021-03-15 DIAGNOSIS — R338 Other retention of urine: Secondary | ICD-10-CM | POA: Diagnosis not present

## 2021-03-26 ENCOUNTER — Telehealth: Payer: Self-pay | Admitting: Adult Health

## 2021-03-26 ENCOUNTER — Encounter: Payer: Self-pay | Admitting: *Deleted

## 2021-03-26 NOTE — Telephone Encounter (Signed)
Please schedule pts appt , [facility] FU Wed The 2nd 2:15 can be use.

## 2021-03-26 NOTE — Telephone Encounter (Signed)
Pt's wife, Daniel Cain (on [place]) called, 1 pm today [facility] because in his head the room was spinning. EMS said vital was good. They ask if he wanted to go to the ED and he declined. [employer] both him home. Would like a call from the nurse to discuss if he can be worked in.

## 2021-03-27 NOTE — Telephone Encounter (Signed)
Scheduled appt on 03/28/21 at 11:15 am. 2:15pm was not available

## 2021-03-28 ENCOUNTER — Encounter: Payer: Self-pay | Admitting: Adult Health

## 2021-03-28 ENCOUNTER — Ambulatory Visit: Payer: Medicare PPO | Admitting: Adult Health

## 2021-03-28 VITALS — BP 145/75 | HR 48 | Ht 71.0 in | Wt 205.0 lb

## 2021-03-28 DIAGNOSIS — Z5181 Encounter for therapeutic drug level monitoring: Secondary | ICD-10-CM | POA: Diagnosis not present

## 2021-03-28 DIAGNOSIS — H8190 Unspecified disorder of vestibular function, unspecified ear: Secondary | ICD-10-CM

## 2021-03-28 NOTE — Progress Notes (Signed)
NEUROLOGY CLINIC FOLLOW UP PATIENT NOTE  NAME: Daniel Cain DOB: November 11, 1940    Chief complaint: Chief Complaint  Patient presents with   Vertigo    Rm 2, FU d/t increased vertigo episodes     HPI:  Update 03/28/2021 JM: Returns for acute visit due to increased frequency of vertigo episodes.  He was previously seen 8 months ago for episodes of vertigo (lightheadedness, dizziness, N/V and imbalance) of unknown etiology (Ddx vestibular paroxysmia vs basilar type migraine vs mid pontine small capillary telangectasia related per Dr. Rhunette Croft)   Concerned regarding increased episodes of vertigo. Since prior visit, he had additional event 02/06/2021 which lasted a total of 3 days -symptoms worse within the first 12 hours but persistent (difficulty ambulating) for almost 72 hours. This event subsided 2-3 hours after dose of meclizine.  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 No other changes in symptoms or characteristics - denies LOC, AMS, weakness, numbness/tingling, speech difficulty  He has remained on Trileptal 600/900 without any missed dosages -denies side effects  He questions possible need of evaluation at teaching hospital    History of events:  05/2014 (onset) 06/2014 - episode 01/2015 - episode 11/9-11/16/2016 - recurrent episodes 04/15/2015 -episodes  04/27/2015 - Dr. Erlinda Hong eval (as below)  05/04/2015 - episode  05/12/2015 - Dr. Erlinda Hong f/u visit -low-dose Trileptal initiated  06/2015 -episode -Trileptal dosage increased to 600mg  BID 09/2019 -episode after 4 years -Trileptal dosage increased to 600mg  AM and 900mg  PM  05/2020 - episode after 1 year -no dosage adjustments made 02/06/2021 - episode lasting 3 days - resolved after meclizine (see  above) 03/25/2021 - lasted 20 min 03/26/2021 - lasted 20 min    Initial eval by Dr. 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 No evidence of central lesion. 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 Lucia Gaskins questionable Mnire's disease -advised low-salt diet and diuretics        ROS: Review of Systems  Constitutional: Negative.   HENT:  Positive for hearing loss. Negative for tinnitus.   Eyes: Negative.   Respiratory: Negative.    Cardiovascular: Negative.   Genitourinary: Negative.   Musculoskeletal: Negative.   Skin: Negative.   Neurological:  Positive for dizziness.  Endo/Heme/Allergies: Negative.   Psychiatric/Behavioral: Negative.        Past Medical History:  Diagnosis Date   Atrial fibrillation ([place])    Bladder calculi    Cardiomyopathy ([place])    Hyperlipidemia    Hypertension    Left bundle branch block    Nocturia    Osteoarthritis    Sleep apnea    Vertigo    Past Surgical History:  Procedure Laterality Date   CARDIOVERSION N/A 09/01/2019   Procedure: CARDIOVERSION;  Surgeon: Donato Heinz, MD;  [facility] END[facility]COPY;  Service: Cardiovascular;  Laterality: N/A;   CARDIOVERSION N/A 11/08/2019   Procedure: CARDIOVERSION;  Surgeon: Geralynn Rile, MD;  Location: [place];  Service: Cardiovascular;  Laterality: N/A;   CYSTOSCOPY WITH LITHOLAPAXY N/A  03/08/2021   Procedure: CYSTOSCOPY WITH LITHOLAPAXY;  Surgeon: Franchot Gallo, MD;  Location: [facility] ORS;  Service: Urology;  Laterality: N/A;   HERNIA REPAIR     TOTAL HIP ARTHROPLASTY Bilateral    TRANSURETHRAL INCISION OF PROSTATE N/A 03/08/2021    Procedure: TRANSURETHRAL INCISION OF THE PROSTATE (TUIP);  Surgeon: Franchot Gallo, MD;  Location: [facility] OR[facility];  Service: Urology;  Laterality: N/A;   Family History  Problem Relation Age of Onset   COPD Mother    Stroke Mother    Hypertension Father    Current Outpatient Medications  Medication Sig Dispense Refill   amiodarone (PACERONE) 200 MG tablet Take 1 tablet (200 mg total) by mouth daily. 3 tablet 0   atorvastatin (LIPITOR) 80 MG tablet Take 1 tablet (80 mg total) by mouth daily. 3 tablet 0   carvedilol (COREG) 6.25 MG tablet Take 1 tablet (6.25 mg total) by mouth 2 (two) times daily with a meal for 3 days. 6 tablet 0   ezetimibe (ZETIA) 10 MG tablet Take 1 tablet (10 mg total) by mouth daily for 3 days. 3 tablet 0   hydrALAZINE (APRESOLINE) 25 MG tablet Take 1 tablet (25 mg total) by mouth 2 (two) times daily for 3 days. 6 tablet 0   meclizine (ANTIVERT) 25 MG tablet Take 25 mg by mouth 4 (four) times daily as needed.     oxcarbazepine (TRILEPTAL) 600 MG tablet Take 1 tablet (600 mg total) by mouth in the morning AND 1.5 tablets (900 mg total) at bedtime. Do all this for 3 days. 8 tablet 0   sildenafil (VIAGRA) 100 MG tablet Take 100 mg by mouth daily as needed for erectile dysfunction.     sulfamethoxazole-trimethoprim (BACTRIM DS) 800-160 MG tablet Take 1 tablet by mouth 2 (two) times daily. 6 tablet 0   No current facility-administered medications for this visit.   No Known Allergies Social History   Socioeconomic History   Marital status: Married    Spouse name: Not on file   Number of children: 3   Years of education: Not on file   Highest education level: Not on file  Occupational History   Not on file  Tobacco Use   Smoking status: Former   Smokeless tobacco: Never  Vaping Use   Vaping Use: Never used  Substance and Sexual Activity   Alcohol use: Yes    Alcohol/week: 1.0 standard drink    Types: 1 Glasses of wine per week    Comment: 1-2 glasses wine per day    Drug use: No   Sexual activity: Never    Birth control/protection: Abstinence  Other Topics Concern   Not on file  Social History Narrative   Not on file   Social Determinants of Health   Financial Resource Strain: Not on file  Food Insecurity: Not on file  Transportation Needs: Not on file  Physical Activity: Not on file  Stress: Not on file  Social Connections: Not on file  Intimate Partner Violence: Not on file     Physical Exam  Today's Vitals   03/28/21 1111  BP: (!) 145/75  Pulse: (!) 48  Weight: 205 lb (93 kg)  Height: 5\' 11"  (1.803 m)   Body mass index is 28.59 kg/m.   General - Well nourished, well developed, very pleasant elderly Caucasian male, in no apparent distress. Cardiovascular -regular rate and rhythm  Mental Status -  Level of arousal and orientation to time, place, and person were intact. Language including expression,  naming, repetition, comprehension, reading, and writing was assessed and found intact. Attention span and concentration were normal. Recent and remote memory were intact. Fund of Knowledge was assessed and was intact.  Cranial Nerves II - XII - II - Visual field intact OU. III, IV, VI - Extraocular movements intact. V - Facial sensation intact bilaterally. VII - Facial movement intact bilaterally. VIII - Hearing & vestibular intact bilaterally. X - Palate elevates symmetrically. XI - Chin turning & shoulder shrug intact bilaterally. XII - Tongue protrusion intact. Motor Strength - The patient's strength was normal in all extremities and pronator drift was absent.  Bulk was normal and fasciculations were absent.   Motor Tone - Muscle tone was assessed at the neck and appendages and was normal. Reflexes - The patient's reflexes were normal in all extremities and he had no pathological reflexes. Sensory - Light touch, temperature/pinprick, vibration and proprioception, and Romberg testing were assessed and were normal.    Coordination - The patient had normal movements in the hands and feet with no ataxia or dysmetria.  Tremor was absent. Gait and Station - The patient's transfers, posture, gait, station, and turns were observed as normal.      Assessment:   Hervey Wedig is a 80 year old male with PMH of HLD and HTN with recurrent vertigo episodes consisting of lightheadedness, dizziness, nausea and vomiting who was started on Trileptal low-dose in 04/2015 - unknown etiology with Ddx includes vestibular paroxymia vs basilar migraine vs mid pontine small capillary telangectasia related vs possible Mnire's disease per Dr. Roda Shutters. Additional episode 06/2015 with Trileptal dosage increased to 600 mg twice daily without recurrent episodes until 05/2019 and 09/2019 with increase Trileptal dosage. Recurrent events: 05/2020 (Trileptal dosage increased), 02/06/2021, 03/25/2021 and 03/26/2021. Dx'd atrial fibrillation in 06/2019 s/p cardioversion 08/2019 with repeat  cardioversion 10/2019.    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  This included previsit chart review, lab review, study review, order entry, electronic health record documentation, patient and wife education regarding recurrent episodes with concern of increased frequency, further evaluation and  possible eval by academic center and answered all other questions to patient and wife satisfaction   Ihor Austin, AGNP-BC  [facility] [place] [facility] [address] [place], [place] [address]  Phone [phone] Fax [phone] Note: This document was prepared with digital dictation and possible smart phrase technology. Any transcriptional errors that result from this process are unintentional.

## 2021-03-28 NOTE — Patient Instructions (Addendum)
Your Plan:  We will check lab work today - based on results, may need to make adjustments to Trileptal in setting of increased vertigo episodes         Thank you for coming to see [place] at [facility]. I hope we have been able to provide you high quality care today.  You may receive a patient satisfaction survey over the next few weeks. We would appreciate your feedback and comments so that we may continue to improve ourselves and the health of our patients.

## 2021-03-29 ENCOUNTER — Telehealth: Payer: Self-pay | Admitting: Adult Health

## 2021-03-29 DIAGNOSIS — R351 Nocturia: Secondary | ICD-10-CM | POA: Diagnosis not present

## 2021-03-29 DIAGNOSIS — N401 Enlarged prostate with lower urinary tract symptoms: Secondary | ICD-10-CM | POA: Diagnosis not present

## 2021-03-29 NOTE — Telephone Encounter (Signed)
Daniel Cain: [id number] (exp. 03/29/21 to 04/28/21) order sent to GI, they will reach out to the patient to schedule.

## 2021-03-30 LAB — BASIC METABOLIC PANEL
BUN/Creatinine Ratio: 17 (ref 10–24)
BUN: 18 mg/dL (ref 8–27)
CO2: 24 mmol/L (ref 20–29)
Calcium: 9.2 mg/dL (ref 8.6–10.2)
Chloride: 98 mmol/L (ref 96–106)
Creatinine, Ser: 1.06 mg/dL (ref 0.76–1.27)
Glucose: 73 mg/dL (ref 70–99)
Potassium: 4.8 mmol/L (ref 3.5–5.2)
Sodium: 132 mmol/L — ABNORMAL LOW (ref 134–144)
eGFR: 71 mL/min/{1.73_m2} (ref >59)

## 2021-03-30 LAB — 10-HYDROXYCARBAZEPINE: Oxcarbazepine SerPl-Mcnc: 16 ug/mL (ref 10–35)

## 2021-03-30 LAB — TSH: TSH: 1.89 u[IU]/mL (ref 0.450–4.500)

## 2021-03-31 ENCOUNTER — Other Ambulatory Visit: Payer: Self-pay | Admitting: Cardiology

## 2021-04-02 ENCOUNTER — Other Ambulatory Visit: Payer: Self-pay | Admitting: Adult Health

## 2021-04-02 DIAGNOSIS — E871 Hypo-osmolality and hyponatremia: Secondary | ICD-10-CM

## 2021-04-05 ENCOUNTER — Ambulatory Visit
Admission: RE | Admit: 2021-04-05 | Discharge: 2021-04-05 | Disposition: A | Discharge status: Home / Self Care | Payer: Medicare PPO | Source: Ambulatory Visit | Attending: Cardiology | Admitting: Cardiology

## 2021-04-05 ENCOUNTER — Other Ambulatory Visit: Payer: Self-pay

## 2021-04-05 DIAGNOSIS — I480 Paroxysmal atrial fibrillation: Secondary | ICD-10-CM

## 2021-04-05 DIAGNOSIS — Z79899 Other long term (current) drug therapy: Secondary | ICD-10-CM | POA: Diagnosis not present

## 2021-04-05 DIAGNOSIS — Z5181 Encounter for therapeutic drug level monitoring: Secondary | ICD-10-CM | POA: Diagnosis not present

## 2021-04-11 DIAGNOSIS — Z20822 Contact with and (suspected) exposure to covid-19: Secondary | ICD-10-CM | POA: Diagnosis not present

## 2021-04-17 ENCOUNTER — Ambulatory Visit: Payer: Medicare PPO | Admitting: Adult Health

## 2021-04-17 DIAGNOSIS — M25552 Pain in left hip: Secondary | ICD-10-CM | POA: Diagnosis not present

## 2021-04-18 ENCOUNTER — Other Ambulatory Visit: Payer: Self-pay | Admitting: Cardiology

## 2021-04-18 DIAGNOSIS — E7800 Pure hypercholesterolemia, unspecified: Secondary | ICD-10-CM

## 2021-04-23 ENCOUNTER — Ambulatory Visit
Admission: RE | Admit: 2021-04-23 | Discharge: 2021-04-23 | Disposition: A | Discharge status: Home / Self Care | Payer: Medicare PPO | Source: Ambulatory Visit | Attending: Adult Health | Admitting: Adult Health

## 2021-04-23 ENCOUNTER — Other Ambulatory Visit: Payer: Self-pay

## 2021-04-23 DIAGNOSIS — H8190 Unspecified disorder of vestibular function, unspecified ear: Secondary | ICD-10-CM | POA: Diagnosis not present

## 2021-04-23 MED ORDER — GADOBENATE DIMEGLUMINE 529 MG/ML IV SOLN
18.0000 mL | Freq: Once | INTRAVENOUS | Status: AC | PRN
  Administered 2021-04-23: 09:00:00 18 mL via INTRAVENOUS

## 2021-05-02 ENCOUNTER — Encounter: Payer: Self-pay | Admitting: Adult Health

## 2021-05-07 DIAGNOSIS — R42 Dizziness and giddiness: Secondary | ICD-10-CM | POA: Diagnosis not present

## 2021-05-07 DIAGNOSIS — H8190 Unspecified disorder of vestibular function, unspecified ear: Secondary | ICD-10-CM | POA: Diagnosis not present

## 2021-05-07 DIAGNOSIS — H903 Sensorineural hearing loss, bilateral: Secondary | ICD-10-CM | POA: Diagnosis not present

## 2021-05-07 DIAGNOSIS — H90A21 Sensorineural hearing loss, unilateral, right ear, with restricted hearing on the contralateral side: Secondary | ICD-10-CM | POA: Diagnosis not present

## 2021-05-07 DIAGNOSIS — H81399 Other peripheral vertigo, unspecified ear: Secondary | ICD-10-CM | POA: Diagnosis not present

## 2021-05-07 DIAGNOSIS — H9313 Tinnitus, bilateral: Secondary | ICD-10-CM | POA: Diagnosis not present

## 2021-05-08 DIAGNOSIS — M25552 Pain in left hip: Secondary | ICD-10-CM | POA: Diagnosis not present

## 2021-05-14 NOTE — Telephone Encounter (Signed)
Reviewed [facility] note - I do not see any mention regarding stopping Trileptal. If he wants pt to stop this, he will need to provide the correct titration to slowly decrease dosage as he has been on this for multiple years. Looks like he is treating him for possible Mnire's to see if that treatment works -  not sure if adjusting other medications during this time would be of benefit.

## 2021-05-31 ENCOUNTER — Encounter: Payer: Self-pay | Admitting: Adult Health

## 2021-05-31 ENCOUNTER — Ambulatory Visit (INDEPENDENT_AMBULATORY_CARE_PROVIDER_SITE_OTHER): Payer: Medicare PPO | Admitting: Adult Health

## 2021-05-31 VITALS — BP 131/70 | HR 53 | Ht 72.0 in | Wt 210.0 lb

## 2021-05-31 DIAGNOSIS — H81399 Other peripheral vertigo, unspecified ear: Secondary | ICD-10-CM | POA: Diagnosis not present

## 2021-05-31 DIAGNOSIS — H8190 Unspecified disorder of vestibular function, unspecified ear: Secondary | ICD-10-CM

## 2021-05-31 MED ORDER — OXCARBAZEPINE 600 MG PO TABS: 600.0000 mg | ORAL_TABLET | Freq: Two times a day (BID) | ORAL | 5 refills | Status: DC

## 2021-05-31 NOTE — Patient Instructions (Addendum)
Decrease oxcarbazepine to 600mg  twice daily - will consider further tapering after follow up with [facility]  Continue to follow with [facility]

## 2021-05-31 NOTE — Progress Notes (Signed)
NEUROLOGY CLINIC FOLLOW UP PATIENT NOTE  NAME: Daniel Cain DOB: 08-18-1940    Chief complaint: Chief Complaint  Patient presents with   Follow-up    RM 3 with spouse Daniel Cain  Pt is well and stable, no episodes since last visit      HPI:  Update 05/31/2021 JM: Patient returns after prior visit 2 months ago accompanied by his wife,.  He has since been seen by [facility] ENT for second opinion regarding vertigo episodes. Personally reviewed Dr. Tristan Schroeder note - sx consistent with Mnire's disease but no aural sx of worsening hearing loss, tinnitus or aural fullness during episodes thus felt likely episodic vertigo.  Trialing Mnire's treatment with betahistine for 3 months, started ~3 wks ago. F/u planned March for vestibular testing. 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  Doing well since prior visit - denies any additional vertigo episodes. Tolerating betahistine well. Questions ongoing need of oxcarbazepine. Reports worsening left hip pain with plans on revision in near future. No further concerns at this time.     MR BRAIN W/WO CONTRAST 04/23/2021 IMPRESSION:  -Mild atrophy and mild chronic small vessel ischemic disease. 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 -No acute findings.       History provided for reference purposes only Update 03/28/2021 JM: Returns for acute visit due to increased frequency of vertigo episodes.  He was previously seen 8 months ago for episodes of vertigo (lightheadedness, dizziness, N/V and imbalance) of unknown etiology (Ddx vestibular paroxysmia vs basilar type migraine vs mid pontine small capillary telangectasia related per Dr. Rhunette Croft)   Concerned regarding increased episodes of vertigo. Since prior visit, he had additional event 02/06/2021 which lasted a total of 3 days -symptoms worse within the first 12 hours but  persistent (difficulty ambulating) for almost 72 hours. This event subsided 2-3 hours after dose of meclizine.  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 No other changes in symptoms or characteristics - denies LOC, AMS, weakness, numbness/tingling, speech difficulty  He has remained on Trileptal 600/900 without any missed dosages -denies side effects  He questions possible need of evaluation at teaching hospital    History of events:  05/2014 (onset) 06/2014 - episode 01/2015 - episode 11/9-11/16/2016 - recurrent episodes 04/15/2015 -episodes  04/27/2015 - Dr. Erlinda Hong eval (as below)  05/04/2015 - episode  05/12/2015 - Dr. Erlinda Hong f/u visit -low-dose Trileptal initiated  06/2015 -episode -Trileptal dosage increased to 600mg  BID 09/2019 -episode after 4 years -Trileptal dosage increased to 600mg  AM and 900mg  PM  05/2020 - episode after 1 year -no dosage adjustments made 02/06/2021 - episode lasting 3 days - resolved after meclizine (see above) 03/25/2021 - lasted 20 min 03/26/2021 - lasted 20 min    Initial eval by Dr. 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 No evidence of central lesion. 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 Lucia Gaskins questionable Mnire's disease -advised low-salt diet and diuretics  ROS: Review of Systems  Constitutional: Negative.   HENT:  Positive for hearing loss. Negative for tinnitus.   Eyes:  Negative.   Respiratory: Negative.    Cardiovascular: Negative.   Genitourinary: Negative.   Musculoskeletal: Negative.   Skin: Negative.   Neurological:  Positive for dizziness.  Endo/Heme/Allergies: Negative.   Psychiatric/Behavioral: Negative.        Past Medical History:  Diagnosis Date   Atrial fibrillation ([place])    Bladder calculi    Cardiomyopathy ([place])    Hyperlipidemia    Hypertension    Left bundle branch block    Nocturia    Osteoarthritis    Sleep apnea    Vertigo    Past Surgical History:  Procedure Laterality Date   CARDIOVERSION N/A 09/01/2019   Procedure: CARDIOVERSION;  Surgeon: Donato Heinz, MD;  Location: [facility] ENDOSCOPY;  Service: Cardiovascular;  Laterality: N/A;   CARDIOVERSION N/A 11/08/2019   Procedure: CARDIOVERSION;  Surgeon: Geralynn Rile, MD;  Location: [place];  Service: Cardiovascular;  Laterality: N/A;   CYSTOSCOPY WITH LITHOLAPAXY N/A 03/08/2021   Procedure: CYSTOSCOPY WITH LITHOLAPAXY;  Surgeon: Franchot Gallo, MD;  Location: [facility] ORS;  Service: Urology;  Laterality: N/A;   HERNIA REPAIR     TOTAL HIP ARTHROPLASTY Bilateral    TRANSURETHRAL INCISION OF PROSTATE N/A 03/08/2021   Procedure: TRANSURETHRAL INCISION OF THE PROSTATE (TUIP);  Surgeon: Franchot Gallo, MD;  Location: [facility] ORS;  Service: Urology;  Laterality: N/A;   Family History  Problem Relation Age of Onset   COPD Mother    Stroke Mother    Hypertension Father    Current Outpatient Medications  Medication Sig Dispense Refill   amiodarone (PACERONE) 200 MG tablet Take 1 tablet (200 mg total) by mouth daily. 3 tablet 0   atorvastatin (LIPITOR) 80 MG tablet Take 1 tablet (80 mg total) by mouth daily. 3 tablet 0   Betahistine HCl (BETAHISTINE DIHYDROCHLORIDE) POWD 24mg  tablet PO twice a day     ENTRESTO 97-103 MG TAKE 1 TABLET BY MOUTH TWICE DAILY. 60 tablet 11   ezetimibe (ZETIA) 10 MG tablet TAKE 1 TABLET EACH DAY. 90 tablet 3   hydrALAZINE  (APRESOLINE) 25 MG tablet Take 1 tablet (25 mg total) by mouth 2 (two) times daily for 3 days. 6 tablet 0   meclizine (ANTIVERT) 25 MG tablet Take 25 mg by mouth 4 (four) times daily as needed.     sildenafil (VIAGRA) 100 MG tablet Take 100 mg by mouth daily as needed for erectile dysfunction.     sulfamethoxazole-trimethoprim (BACTRIM DS) 800-160 MG tablet Take 1 tablet by mouth 2 (two) times daily. 6 tablet 0   No current facility-administered medications for this visit.   No Known Allergies Social History   Socioeconomic History   Marital status: Married    Spouse name: Not on file   Number of children: 3   Years of education: Not on file   Highest education level: Not on file  Occupational History   Not on file  Tobacco Use   Smoking status: Former   Smokeless tobacco: Never  Vaping Use   Vaping Use: Never used  Substance and Sexual Activity   Alcohol use: Yes    Alcohol/week: 1.0 standard drink    Types: 1 Glasses of wine per week    Comment: 1-2 glasses wine per day   Drug use: No   Sexual activity: Never    Birth control/protection: Abstinence  Other Topics Concern   Not on  file  Social History Narrative   Not on file   Social Determinants of Health   Financial Resource Strain: Not on file  Food Insecurity: Not on file  Transportation Needs: Not on file  Physical Activity: Not on file  Stress: Not on file  Social Connections: Not on file  Intimate Partner Violence: Not on file     Physical Exam  Today's Vitals   05/31/21 0748  BP: 131/70  Pulse: (!) 53  Weight: 210 lb (95.3 kg)  Height: 6' (1.829 m)   Body mass index is 28.48 kg/m.   General - Well nourished, well developed, very pleasant elderly Caucasian male, in no apparent distress. Cardiovascular -regular rate and rhythm  Mental Status -  Level of arousal and orientation to time, place, and person were intact. Language including expression, naming, repetition, comprehension, reading, and  writing was assessed and found intact. Attention span and concentration were normal. Recent and remote memory were intact. Fund of Knowledge was assessed and was intact.  Cranial Nerves II - XII - II - Visual field intact OU. III, IV, VI - Extraocular movements intact. V - Facial sensation intact bilaterally. VII - Facial movement intact bilaterally. VIII - HOH R>L X - Palate elevates symmetrically. XI - Chin turning & shoulder shrug intact bilaterally. XII - Tongue protrusion intact. Motor Strength - The patients strength was normal in all extremities and pronator drift was absent.  Bulk was normal and fasciculations were absent.   Motor Tone - Muscle tone was assessed at the neck and appendages and was normal. Reflexes - The patients reflexes were normal in all extremities and he had no pathological reflexes. Sensory - Light touch, temperature/pinprick, vibration and proprioception, and Romberg testing were assessed and were normal.   Coordination - The patient had normal movements in the hands and feet with no ataxia or dysmetria.  Tremor was absent. Gait and Station - The patient's transfers, posture, gait, station, and turns were observed as normal although mild left leg favoring due to hip pain      Assessment:   Jaziah Rosche is a 81 year old male with PMH of HLD and HTN with recurrent vertigo episodes consisting of lightheadedness, dizziness, nausea and vomiting who was started on Trileptal low-dose in 04/2015 - unknown etiology with Ddx includes vestibular paroxymia vs basilar migraine vs mid pontine small capillary telangectasia related vs possible Mnire's disease per Dr. Erlinda Hong. Additional episode 06/2015 with Trileptal dosage increased to 600 mg twice daily without recurrent episodes until 05/2019 and 09/2019 with increase Trileptal dosage. Recurrent events: 05/2020 (Trileptal dosage increased), 02/06/2021, 03/25/2021 and 03/26/2021. Dx'd atrial fibrillation in 06/2019 s/p  cardioversion 08/2019 with repeat  cardioversion 10/2019.    Plan:  -eval by [facility] Dr. Amado Coe 04/2021 - dx'd with likely episodic peripheral vertigo vs Mnire's - on betahistine - no additional events. Has f/u in March  -will slowly wean off oxcarbazepine - currently on 600/900 - will decrease to 600mg  BID until f/u with [facility]. Will need to wean off slowly as he has been on for over 7 years  -repeat MR brain w/wo contrast 03/2021 no acute findings. Stable anterior pontine capillary telangiectasia. Did not have focus on IAC - will defer to [facility] if this is warranted  -BMP, TSH and oxcarbazepine level 03/2021 WNL except mild hyponatremia    Continue to follow with [facility] - after follow up visit in March, will further discuss continued taper of oxcarbazepine    CC:  Daniel Loader,  FNP   [facility]: Dr. Amado Coe   I spent 24 minutes of face-to-face and non-face-to-face time with patient and wife.  This included previsit chart review, lab review, study review, order entry, electronic health record documentation, patient and wife education regarding recurrent vertigo and evaluation with [facility], use of oxcarbazepine and taper and answered all other questions to patient and wife satisfaction  Frann Rider, AGNP-BC  [facility] [address] [place],  [address]  Phone [phone] Fax [phone] Note: This document was prepared with digital dictation and possible smart phrase technology. Any transcriptional errors that result from this process are unintentional.

## 2021-06-14 DIAGNOSIS — N401 Enlarged prostate with lower urinary tract symptoms: Secondary | ICD-10-CM | POA: Diagnosis not present

## 2021-06-14 DIAGNOSIS — D6869 Other thrombophilia: Secondary | ICD-10-CM | POA: Diagnosis not present

## 2021-06-14 DIAGNOSIS — I447 Left bundle-branch block, unspecified: Secondary | ICD-10-CM | POA: Diagnosis not present

## 2021-06-14 DIAGNOSIS — Z0000 Encounter for general adult medical examination without abnormal findings: Secondary | ICD-10-CM | POA: Diagnosis not present

## 2021-06-14 DIAGNOSIS — R7301 Impaired fasting glucose: Secondary | ICD-10-CM | POA: Diagnosis not present

## 2021-06-14 DIAGNOSIS — I10 Essential (primary) hypertension: Secondary | ICD-10-CM | POA: Diagnosis not present

## 2021-06-14 DIAGNOSIS — I4891 Unspecified atrial fibrillation: Secondary | ICD-10-CM | POA: Diagnosis not present

## 2021-06-14 DIAGNOSIS — E782 Mixed hyperlipidemia: Secondary | ICD-10-CM | POA: Diagnosis not present

## 2021-06-18 ENCOUNTER — Other Ambulatory Visit: Payer: Self-pay | Admitting: Cardiology

## 2021-06-18 ENCOUNTER — Telehealth: Payer: Self-pay | Admitting: Physician Assistant

## 2021-06-18 MED ORDER — APIXABAN 5 MG PO TABS: 5.0000 mg | ORAL_TABLET | Freq: Two times a day (BID) | ORAL | 3 refills | Status: DC

## 2021-06-18 NOTE — Telephone Encounter (Signed)
Patient called after hours to get defeated off Eliquis.  He has history of atrial fibrillation and takes Eliquis long-term.  He was on Eliquis while seen by Dr. Stanford Breed in September 2022.  Some reason his Eliquis currently not listed on medication.  Patient denies any bleeding issue.Rx sent to pharmacy.

## 2021-07-09 DIAGNOSIS — R338 Other retention of urine: Secondary | ICD-10-CM | POA: Diagnosis not present

## 2021-07-09 DIAGNOSIS — N401 Enlarged prostate with lower urinary tract symptoms: Secondary | ICD-10-CM | POA: Diagnosis not present

## 2021-07-09 DIAGNOSIS — N210 Calculus in bladder: Secondary | ICD-10-CM | POA: Diagnosis not present

## 2021-08-06 DIAGNOSIS — Z79899 Other long term (current) drug therapy: Secondary | ICD-10-CM | POA: Diagnosis not present

## 2021-08-06 DIAGNOSIS — H8190 Unspecified disorder of vestibular function, unspecified ear: Secondary | ICD-10-CM | POA: Diagnosis not present

## 2021-08-06 DIAGNOSIS — Z974 Presence of external hearing-aid: Secondary | ICD-10-CM | POA: Diagnosis not present

## 2021-08-06 DIAGNOSIS — H8101 Meniere's disease, right ear: Secondary | ICD-10-CM | POA: Diagnosis not present

## 2021-08-06 DIAGNOSIS — Z87891 Personal history of nicotine dependence: Secondary | ICD-10-CM | POA: Diagnosis not present

## 2021-08-06 DIAGNOSIS — H8191 Unspecified disorder of vestibular function, right ear: Secondary | ICD-10-CM | POA: Diagnosis not present

## 2021-08-16 ENCOUNTER — Ambulatory Visit: Payer: Medicare PPO | Admitting: Cardiology

## 2021-08-16 DIAGNOSIS — M25552 Pain in left hip: Secondary | ICD-10-CM | POA: Diagnosis not present

## 2021-08-20 NOTE — Progress Notes (Signed)
? ? ? ? ?HPI: FU atrial fibrillation. Cardiac catheterization April 2003 showed normal coronary arteries and ejection fraction 45 to 50%.  Nuclear study April 2016 showed ejection fraction 66%, artifact suggestive of left bundle branch block but no ischemia. Patient previously seen by Dr. Rex Kras with complaints of increased dyspnea on exertion.  Electrocardiogram showed atrial fibrillation.  Echocardiogram March 2021 showed ejection fraction 25 to 30%, mild RV dysfunction and mild right ventricular enlargement, severe biatrial enlargement, mild to moderate mitral regurgitation and tricuspid regurgitation and mildly dilated aortic root.  Abdominal ultrasound March 2021 showed no aneurysm. Patient had successful cardioversion on September 01, 2019. At time of follow-up he was back in atrial fibrillation.  We initiated amiodarone and he underwent cardioversion successfully June 2021. Cardiac CTA October 2021 showed calcium score 326, 50 to 69% mid LAD stenosis, 25 to 49% ramus intermedius stenosis and diminutive or possibly occluded left circumflex.  FFR 0.76 in the distal LAD suggestive of flow-limiting stenosis.  Echocardiogram July 2022 showed ejection fraction 40%, dyssynchrony due to left bundle branch block, grade 2 diastolic dysfunction, mild RV dysfunction, severe left atrial enlargement, mild right atrial enlargement and mildly dilated aortic root at 42 mm.  Since last seen he denies dyspnea, chest pain, palpitations or syncope. ? ?Current Outpatient Medications  ?Medication Sig Dispense Refill  ? alfuzosin (UROXATRAL) 10 MG 24 hr tablet Take 10 mg by mouth daily with breakfast.    ? amiodarone (PACERONE) 200 MG tablet Take 1 tablet (200 mg total) by mouth daily. 3 tablet 0  ? apixaban (ELIQUIS) 5 MG TABS tablet Take 1 tablet (5 mg total) by mouth 2 (two) times daily. 180 tablet 3  ? atorvastatin (LIPITOR) 80 MG tablet Take 1 tablet (80 mg total) by mouth daily. 3 tablet 0  ? Betahistine HCl (BETAHISTINE  DIHYDROCHLORIDE) POWD 24mg  tablet PO twice a day    ? carvedilol (COREG) 12.5 MG tablet Take 12.5 mg by mouth 2 (two) times daily with a meal.    ? ENTRESTO 97-103 MG TAKE 1 TABLET BY MOUTH TWICE DAILY. 60 tablet 11  ? ezetimibe (ZETIA) 10 MG tablet TAKE 1 TABLET EACH DAY. 90 tablet 3  ? meclizine (ANTIVERT) 25 MG tablet Take 25 mg by mouth 4 (four) times daily as needed.    ? oxcarbazepine (TRILEPTAL) 600 MG tablet Take 1 tablet (600 mg total) by mouth 2 (two) times daily. 60 tablet 5  ? sildenafil (VIAGRA) 100 MG tablet Take 100 mg by mouth daily as needed for erectile dysfunction.    ? sulfamethoxazole-trimethoprim (BACTRIM DS) 800-160 MG tablet Take 1 tablet by mouth 2 (two) times daily. 6 tablet 0  ? hydrALAZINE (APRESOLINE) 25 MG tablet Take 1 tablet (25 mg total) by mouth 2 (two) times daily for 3 days. 6 tablet 0  ? ?No current facility-administered medications for this visit.  ? ? ? ?Past Medical History:  ?Diagnosis Date  ? Atrial fibrillation ([place])   ? Bladder calculi   ? Cardiomyopathy ([place])   ? Hyperlipidemia   ? Hypertension   ? Left bundle branch block   ? Nocturia   ? Osteoarthritis   ? Sleep apnea   ? Vertigo   ? ? ?Past Surgical History:  ?Procedure Laterality Date  ? CARDIOVERSION N/A 09/01/2019  ? Procedure: CARDIOVERSION;  Surgeon: Donato Heinz, MD;  Location: [facility] ENDOSCOPY;  Service: Cardiovascular;  Laterality: N/A;  ? CARDIOVERSION N/A 11/08/2019  ? Procedure: CARDIOVERSION;  Surgeon: Geralynn Rile, MD;  Location:  [facility] ENDOSCOPY;  Service: Cardiovascular;  Laterality: N/A;  ? CYSTOSCOPY WITH LITHOLAPAXY N/A 03/08/2021  ? Procedure: CYSTOSCOPY WITH LITHOLAPAXY;  Surgeon: Franchot Gallo, MD;  Location: [facility] ORS;  Service: Urology;  Laterality: N/A;  ? HERNIA REPAIR    ? TOTAL HIP ARTHROPLASTY Bilateral   ? TRANSURETHRAL INCISION OF PROSTATE N/A 03/08/2021  ? Procedure: TRANSURETHRAL INCISION OF THE PROSTATE (TUIP);  Surgeon: Franchot Gallo, MD;  Location: [facility] ORS;  Service:  Urology;  Laterality: N/A;  ? ? ?Social History  ? ?Socioeconomic History  ? Marital status: Married  ?  Spouse name: Not on file  ? Number of children: 3  ? Years of education: Not on file  ? Highest education level: Not on file  ?Occupational History  ? Not on file  ?Tobacco Use  ? Smoking status: Former  ? Smokeless tobacco: Never  ?Vaping Use  ? Vaping Use: Never used  ?Substance and Sexual Activity  ? Alcohol use: Yes  ?  Alcohol/week: 1.0 standard drink  ?  Types: 1 Glasses of wine per week  ?  Comment: 1-2 glasses wine per day  ? Drug use: No  ? Sexual activity: Never  ?  Birth control/protection: Abstinence  ?Other Topics Concern  ? Not on file  ?Social History Narrative  ? Not on file  ? ?Social Determinants of Health  ? ?Financial Resource Strain: Not on file  ?Food Insecurity: Not on file  ?Transportation Needs: Not on file  ?Physical Activity: Not on file  ?Stress: Not on file  ?Social Connections: Not on file  ?Intimate Partner Violence: Not on file  ? ? ?Family History  ?Problem Relation Age of Onset  ? COPD Mother   ? Stroke Mother   ? Hypertension Father   ? ? ?ROS: no fevers or chills, productive cough, hemoptysis, dysphasia, odynophagia, melena, hematochezia, dysuria, hematuria, rash, seizure activity, orthopnea, PND, pedal edema, claudication. Remaining systems are negative. ? ?Physical Exam: ?Well-developed well-nourished in no acute distress.  ?Skin is warm and dry.  ?HEENT is normal.  ?Neck is supple.  ?Chest is clear to auscultation with normal expansion.  ?Cardiovascular exam is regular rate and rhythm.  ?Abdominal exam nontender or distended. No masses palpated. ?Extremities show no edema. ?neuro grossly intact ? ?ECG-sinus bradycardia at a rate of 49, first-degree AV block, left bundle branch block.  Personally reviewed ? ?A/P ? ?1 paroxysmal atrial fibrillation-patient is in sinus rhythm on examination today.  We will continue amiodarone and apixaban at present dose.  Check hemoglobin,  renal function, TSH and liver functions.  Chest x-ray in November showed no evidence of active cardiopulmonary disease. ? ?2 coronary artery disease-patient denies chest pain.  Continue medical therapy.  Continue Lipitor.  He is not on aspirin given need for anticoagulation long-term. ? ?3 hyperlipidemia-continue Lipitor.  Check lipids and liver. ? ?4 hypertension-blood pressure controlled.  Continue present medical regimen and follow-up. ? ?5 history of cardiomyopathy-out of proportion to coronary disease.  Continue Entresto and carvedilol.  We will likely repeat his echocardiogram when he returns in 1 year. ? ?6 obstructive sleep apnea-managed by Dr. Claiborne Billings. ? ?7 preoperative evaluation prior to hip replacement-he feels as though he will likely have hip replacement in July.  He has excellent functional capacity and is able to ride a stationary bicycle for 35 minutes with no dyspnea or chest pain.  He may proceed without further cardiac evaluation.  Hold apixaban 2 days prior to procedure and resume after when okay with orthopedics and  hemostasis achieved. ? ?Kirk Ruths, MD ? ? ? ?

## 2021-08-28 ENCOUNTER — Encounter: Payer: Self-pay | Admitting: Cardiology

## 2021-08-28 ENCOUNTER — Ambulatory Visit: Payer: Medicare PPO | Admitting: Cardiology

## 2021-08-28 VITALS — BP 130/70 | HR 49 | Resp 20 | Ht 72.0 in | Wt 209.6 lb

## 2021-08-28 DIAGNOSIS — I420 Dilated cardiomyopathy: Secondary | ICD-10-CM | POA: Diagnosis not present

## 2021-08-28 DIAGNOSIS — E7800 Pure hypercholesterolemia, unspecified: Secondary | ICD-10-CM

## 2021-08-28 DIAGNOSIS — I2510 Atherosclerotic heart disease of native coronary artery without angina pectoris: Secondary | ICD-10-CM

## 2021-08-28 DIAGNOSIS — G4733 Obstructive sleep apnea (adult) (pediatric): Secondary | ICD-10-CM | POA: Diagnosis not present

## 2021-08-28 DIAGNOSIS — I429 Cardiomyopathy, unspecified: Secondary | ICD-10-CM

## 2021-08-28 DIAGNOSIS — I480 Paroxysmal atrial fibrillation: Secondary | ICD-10-CM

## 2021-08-28 DIAGNOSIS — I10 Essential (primary) hypertension: Secondary | ICD-10-CM

## 2021-08-28 DIAGNOSIS — E785 Hyperlipidemia, unspecified: Secondary | ICD-10-CM

## 2021-08-28 DIAGNOSIS — Z01810 Encounter for preprocedural cardiovascular examination: Secondary | ICD-10-CM | POA: Diagnosis not present

## 2021-08-28 LAB — LIPID PANEL
Chol/HDL Ratio: 2.1 ratio (ref 0.0–5.0)
Cholesterol, Total: 115 mg/dL (ref 100–199)
HDL: 54 mg/dL (ref >39)
LDL Chol Calc (NIH): 48 mg/dL (ref 0–99)
Triglycerides: 56 mg/dL (ref 0–149)
VLDL Cholesterol Cal: 13 mg/dL (ref 5–40)

## 2021-08-28 LAB — COMPREHENSIVE METABOLIC PANEL
ALT: 22 IU/L (ref 0–44)
AST: 20 IU/L (ref 0–40)
Albumin/Globulin Ratio: 2.4 — ABNORMAL HIGH (ref 1.2–2.2)
Albumin: 4.3 g/dL (ref 3.7–4.7)
Alkaline Phosphatase: 79 IU/L (ref 44–121)
BUN/Creatinine Ratio: 24 (ref 10–24)
BUN: 20 mg/dL (ref 8–27)
Bilirubin Total: 0.5 mg/dL (ref 0.0–1.2)
CO2: 23 mmol/L (ref 20–29)
Calcium: 8.6 mg/dL (ref 8.6–10.2)
Chloride: 97 mmol/L (ref 96–106)
Creatinine, Ser: 0.85 mg/dL (ref 0.76–1.27)
Globulin, Total: 1.8 g/dL (ref 1.5–4.5)
Glucose: 101 mg/dL — ABNORMAL HIGH (ref 70–99)
Potassium: 4.5 mmol/L (ref 3.5–5.2)
Sodium: 132 mmol/L — ABNORMAL LOW (ref 134–144)
Total Protein: 6.1 g/dL (ref 6.0–8.5)
eGFR: 88 mL/min/{1.73_m2} (ref >59)

## 2021-08-28 LAB — CBC
Hematocrit: 37.6 % (ref 37.5–51.0)
Hemoglobin: 12.6 g/dL — ABNORMAL LOW (ref 13.0–17.7)
MCH: 31.7 pg (ref 26.6–33.0)
MCHC: 33.5 g/dL (ref 31.5–35.7)
MCV: 95 fL (ref 79–97)
Platelets: 181 10*3/uL (ref 150–450)
RBC: 3.97 x10E6/uL — ABNORMAL LOW (ref 4.14–5.80)
RDW: 11.8 % (ref 11.6–15.4)
WBC: 4.8 10*3/uL (ref 3.4–10.8)

## 2021-08-28 LAB — TSH: TSH: 2.19 u[IU]/mL (ref 0.450–4.500)

## 2021-08-28 NOTE — Patient Instructions (Signed)

## 2021-08-29 ENCOUNTER — Other Ambulatory Visit: Payer: Self-pay | Admitting: *Deleted

## 2021-08-29 DIAGNOSIS — I2510 Atherosclerotic heart disease of native coronary artery without angina pectoris: Secondary | ICD-10-CM

## 2021-10-01 ENCOUNTER — Other Ambulatory Visit: Payer: Self-pay | Admitting: Cardiology

## 2021-10-01 DIAGNOSIS — I420 Dilated cardiomyopathy: Secondary | ICD-10-CM

## 2021-10-02 ENCOUNTER — Other Ambulatory Visit: Payer: Self-pay | Admitting: Cardiology

## 2021-10-02 DIAGNOSIS — I420 Dilated cardiomyopathy: Secondary | ICD-10-CM

## 2021-10-03 ENCOUNTER — Telehealth: Payer: Self-pay | Admitting: Cardiology

## 2021-10-03 NOTE — Telephone Encounter (Signed)
Patient concerned that coreg had been stopped. Per Dr. Ludwig Clarks note on 08/28/21, patient was to continue with Coreg. Patient stated his pharmacy just called him that they refilled his medication. ?

## 2021-10-03 NOTE — Telephone Encounter (Signed)
Pt c/o medication issue: ? ?1. Name of Medication: carvedilol (COREG) 12.5 MG tablet ? ?2. How are you currently taking this medication (dosage and times per day)? Pt is out ? ?3. Are you having a reaction (difficulty breathing--STAT)? no ? ?4. What is your medication issue? Patient states that he went to [facility] to refill this medication and he was told by them that he is not supposed to be on this medication. He states he was told that the medication was discontinued back in October and just needs clarification. Please advise.  ?

## 2021-10-15 DIAGNOSIS — H6123 Impacted cerumen, bilateral: Secondary | ICD-10-CM | POA: Diagnosis not present

## 2021-10-15 DIAGNOSIS — I10 Essential (primary) hypertension: Secondary | ICD-10-CM | POA: Diagnosis not present

## 2021-10-24 ENCOUNTER — Telehealth: Payer: Self-pay | Admitting: *Deleted

## 2021-10-24 NOTE — Telephone Encounter (Signed)
Yes, recommend 3 day Eliquis hold before hip surgery. Renal function is normal, CHADS2VASC is 5, calculated below.  CHA2DS2-VASc Score = 5  This indicates a 7.2% annual risk of stroke. The patient's score is based upon: CHF History: 1 HTN History: 1 Diabetes History: 0 Stroke History: 0 Vascular Disease History: 1 Age Score: 2 Gender Score: 0

## 2021-10-24 NOTE — Telephone Encounter (Signed)
CLEARANCE NOTES HAVE BEEN MAILED TO THE PT AS WELL PER PT REQUEST

## 2021-10-24 NOTE — Telephone Encounter (Signed)
Patient is cleared for surgery as mentioned in the earlier note, after checking with our clinical pharmacist, we recommend hold Eliquis for 3 days prior to the surgery.  I have called and informed the patient as well.  He requested a copy of the cardiac clearance to be mailed to his home.  I have verified his home address.

## 2021-10-24 NOTE — Telephone Encounter (Signed)
Given spinal anesthesia, should he hold Eliquis for 3 days instead of 2 days as previously recommended by Dr. Jens Som

## 2021-10-24 NOTE — Telephone Encounter (Signed)
   Pre-operative Risk Assessment    Patient Name: Daniel Cain  DOB: 16-Mar-1941 MRN: [id number]      Request for Surgical Clearance    Procedure:   LEFT HIP REVISION  Date of Surgery:  Clearance TBD                                 Surgeon:  DR. Thamas Jaegers Surgeon's Group or Practice Name:  [facility] [facility]  Phone number:  [phone] Fax number:  [phone]   Type of Clearance Requested:   - Medical  - Pharmacy:  Hold Apixaban (Eliquis)     Type of Anesthesia:  Spinal   Additional requests/questions:    Elpidio Anis   10/24/2021, 2:15 PM

## 2021-10-24 NOTE — Telephone Encounter (Signed)
    Patient Name: Daniel Cain  DOB: Jun 14, 1940 MRN: [id number]  Primary Cardiologist: None  Chart reviewed as part of pre-operative protocol coverage. Given past medical history and time since last visit, based on ACC/AHA guidelines, Daniel Cain would be at acceptable risk for the planned procedure without further cardiovascular testing.   Patient was recently seen by Dr. Jens Som in the cardiology office on 08/28/2021 at which time Dr. Jens Som mention "preoperative evaluation prior to hip replacement-he feels as though he will likely have hip replacement in July.  He has excellent functional capacity and is able to ride a stationary bicycle for 35 minutes with no dyspnea or chest pain.  He may proceed without further cardiac evaluation.  Hold apixaban 2 days prior to procedure and resume after when okay with orthopedics and hemostasis achieved."  The patient was advised that if he develops new symptoms prior to surgery to contact our office to arrange for a follow-up visit, and he verbalized understanding.  I will route this recommendation to the requesting party via Epic fax function and remove from pre-op pool.  Please call with questions.  [place], [place] 10/24/2021, 2:38 PM

## 2021-10-30 DIAGNOSIS — H40013 Open angle with borderline findings, low risk, bilateral: Secondary | ICD-10-CM | POA: Diagnosis not present

## 2021-10-30 DIAGNOSIS — H35371 Puckering of macula, right eye: Secondary | ICD-10-CM | POA: Diagnosis not present

## 2021-10-30 DIAGNOSIS — H2513 Age-related nuclear cataract, bilateral: Secondary | ICD-10-CM | POA: Diagnosis not present

## 2021-10-30 DIAGNOSIS — H35341 Macular cyst, hole, or pseudohole, right eye: Secondary | ICD-10-CM | POA: Diagnosis not present

## 2021-10-31 DIAGNOSIS — M25552 Pain in left hip: Secondary | ICD-10-CM | POA: Diagnosis not present

## 2021-10-31 DIAGNOSIS — Z01818 Encounter for other preprocedural examination: Secondary | ICD-10-CM | POA: Diagnosis not present

## 2021-10-31 DIAGNOSIS — B351 Tinea unguium: Secondary | ICD-10-CM | POA: Diagnosis not present

## 2021-11-15 ENCOUNTER — Other Ambulatory Visit: Payer: Self-pay | Admitting: Cardiology

## 2021-12-04 DIAGNOSIS — I2510 Atherosclerotic heart disease of native coronary artery without angina pectoris: Secondary | ICD-10-CM | POA: Diagnosis not present

## 2021-12-05 LAB — CBC
Hematocrit: 37.3 % — ABNORMAL LOW (ref 37.5–51.0)
Hemoglobin: 12.8 g/dL — ABNORMAL LOW (ref 13.0–17.7)
MCH: 32.7 pg (ref 26.6–33.0)
MCHC: 34.3 g/dL (ref 31.5–35.7)
MCV: 95 fL (ref 79–97)
Platelets: 154 10*3/uL (ref 150–450)
RBC: 3.91 x10E6/uL — ABNORMAL LOW (ref 4.14–5.80)
RDW: 11.8 % (ref 11.6–15.4)
WBC: 5 10*3/uL (ref 3.4–10.8)

## 2021-12-25 DIAGNOSIS — H40013 Open angle with borderline findings, low risk, bilateral: Secondary | ICD-10-CM | POA: Diagnosis not present

## 2022-02-06 NOTE — Telephone Encounter (Signed)
Received fax from surgeon's office to advise that surgery has been rescheduled for 05/22/22.  Does pt need new clearance, per surgeon's office?

## 2022-02-07 NOTE — Telephone Encounter (Signed)
   Pre-operative Risk Assessment    Patient Name: Daniel Cain  DOB: 10-19-1940 MRN: [id number]      Request for Surgical Clearance    Procedure:   LEFT HIP REVISION  Date of Surgery:  Clearance 05/22/22                                 Surgeon:  DR. Thamas Jaegers Surgeon's Group or Practice Name:  [facility] Phone number:  [phone] Fax number:  [phone]   Type of Clearance Requested:   - Medical  - Pharmacy:  Hold Apixaban (Eliquis)     Type of Anesthesia:  Spinal   Additional requests/questions:    Elpidio Anis   02/07/2022, 10:56 AM

## 2022-02-13 NOTE — Telephone Encounter (Signed)
I am coming on board today to cover preop. Will route to pharm for anticoag then will need to route to callback for VV to ensure no changes from last OV (08/2021 - clearance discussed but now out of date).

## 2022-02-18 NOTE — Telephone Encounter (Signed)
   Name: Daniel Cain  DOB: 1940/10/04  MRN: [id number]  Primary Cardiologist: None   Preoperative team, please contact this patient and set up a phone call appointment for further preoperative risk assessment. Please obtain consent and complete medication review. Thank you for your help.  I confirm that guidance regarding antiplatelet and oral anticoagulation therapy has been completed and, if necessary, noted below.  Per office protocol, patient may hold Eliquis for 3 days prior to procedure.    Lenna Sciara, NP 02/18/2022, 4:10 PM [place]

## 2022-02-18 NOTE — Telephone Encounter (Signed)
Patient can still hold for 3 days before procedure

## 2022-02-18 NOTE — Telephone Encounter (Signed)
I left a message for the patient give the office a call to schedule an telephone visit.

## 2022-02-19 NOTE — Telephone Encounter (Signed)
Dr. Stanford Breed is primary cardiologist. Left message to call back to set up tele pre op appt late Nov or early Dec 2023. Procedure it set for 05/22/22

## 2022-02-19 NOTE — Telephone Encounter (Signed)
Patient returned CMA's call. 

## 2022-02-20 DIAGNOSIS — D6869 Other thrombophilia: Secondary | ICD-10-CM | POA: Diagnosis not present

## 2022-02-20 DIAGNOSIS — I4891 Unspecified atrial fibrillation: Secondary | ICD-10-CM | POA: Diagnosis not present

## 2022-02-20 DIAGNOSIS — I10 Essential (primary) hypertension: Secondary | ICD-10-CM | POA: Diagnosis not present

## 2022-02-20 DIAGNOSIS — Z01818 Encounter for other preprocedural examination: Secondary | ICD-10-CM | POA: Diagnosis not present

## 2022-02-20 DIAGNOSIS — R7301 Impaired fasting glucose: Secondary | ICD-10-CM | POA: Diagnosis not present

## 2022-02-20 IMAGING — CR DG CHEST 2V
2 series · 2 of 2 positions shown · non-contrast
Comparison: Chest x-ray 05/27/2020.

CLINICAL DATA: 80-year-old male under surveillance for amiodarone
therapy.

EXAM:
CHEST - 2 VIEW

[w chest pa]
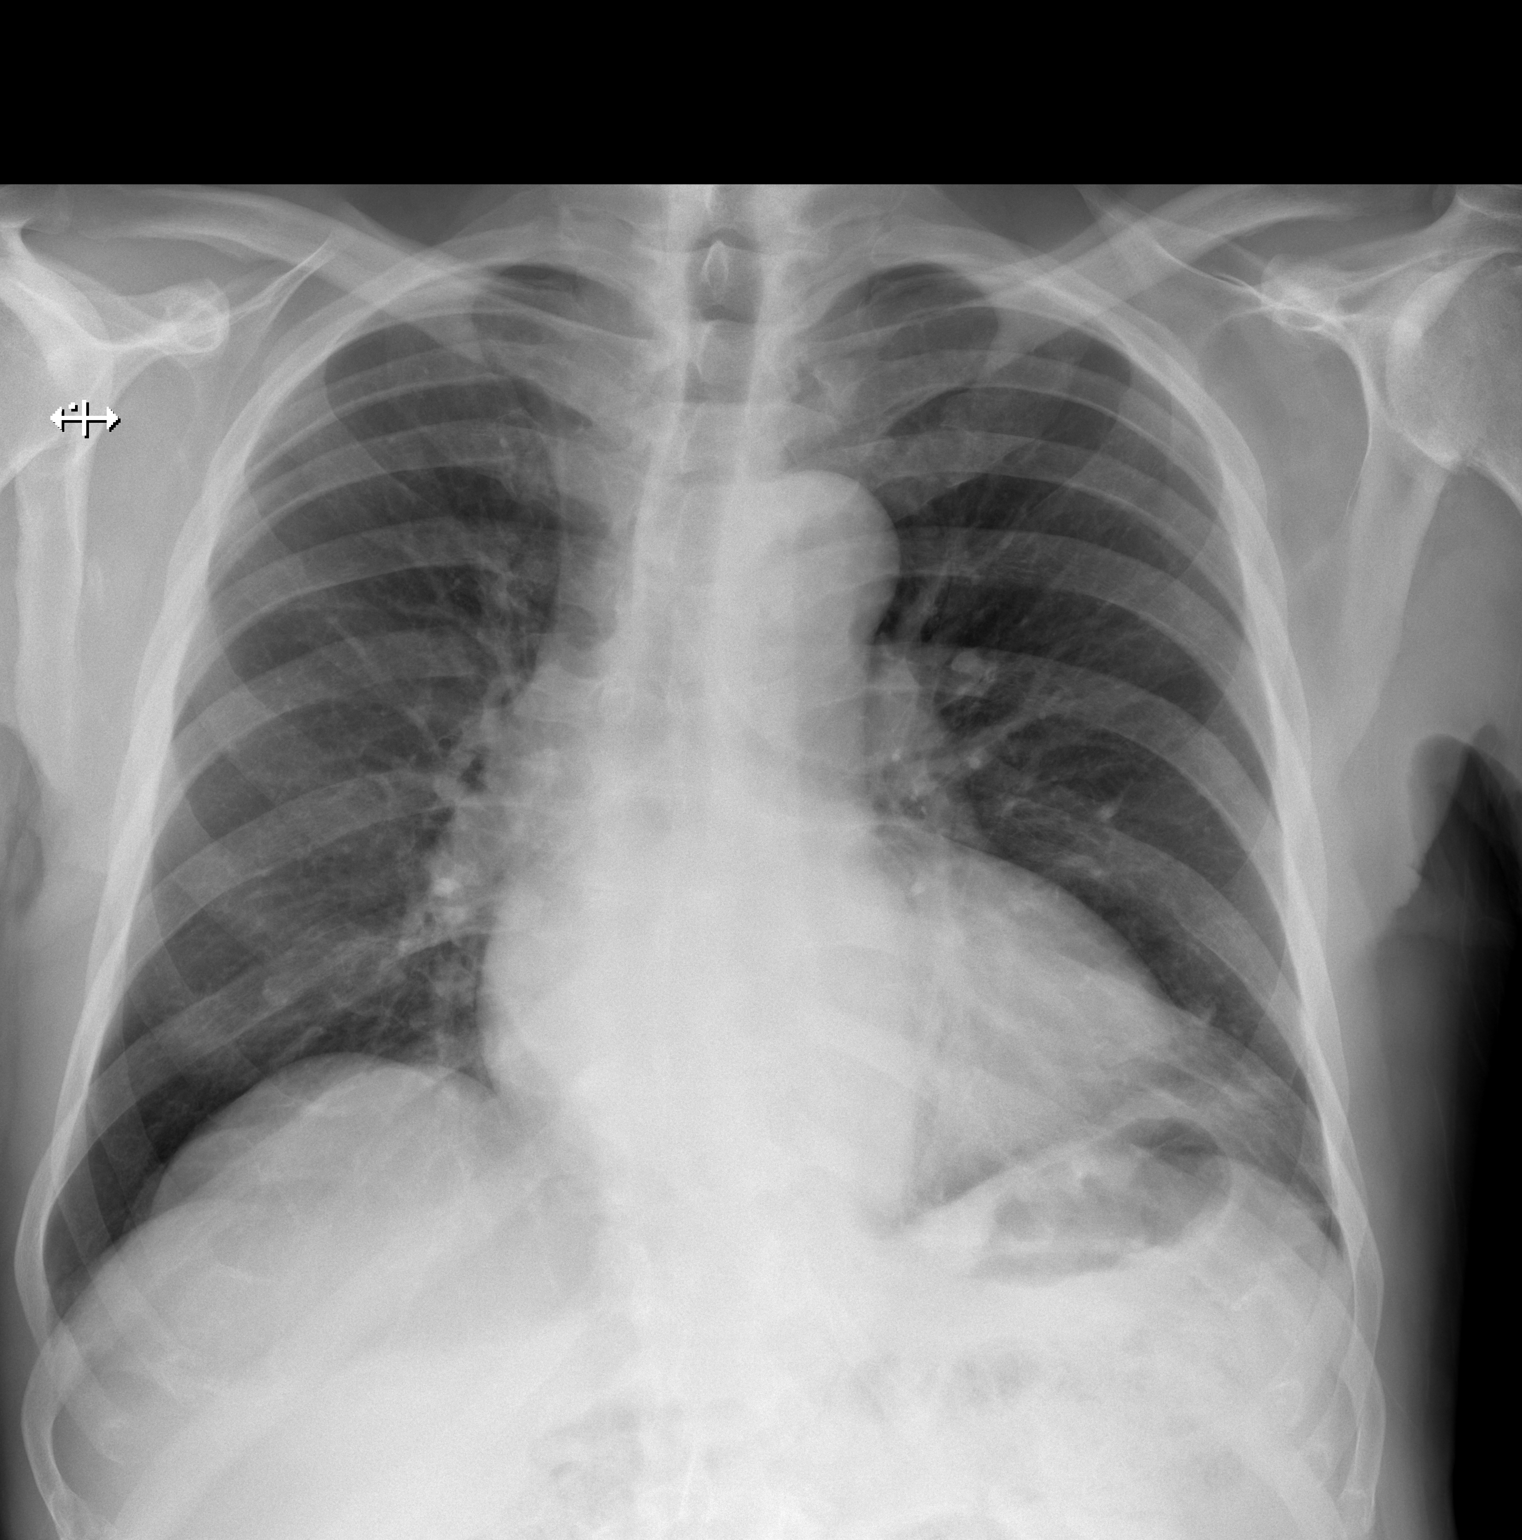

[w chest lat]
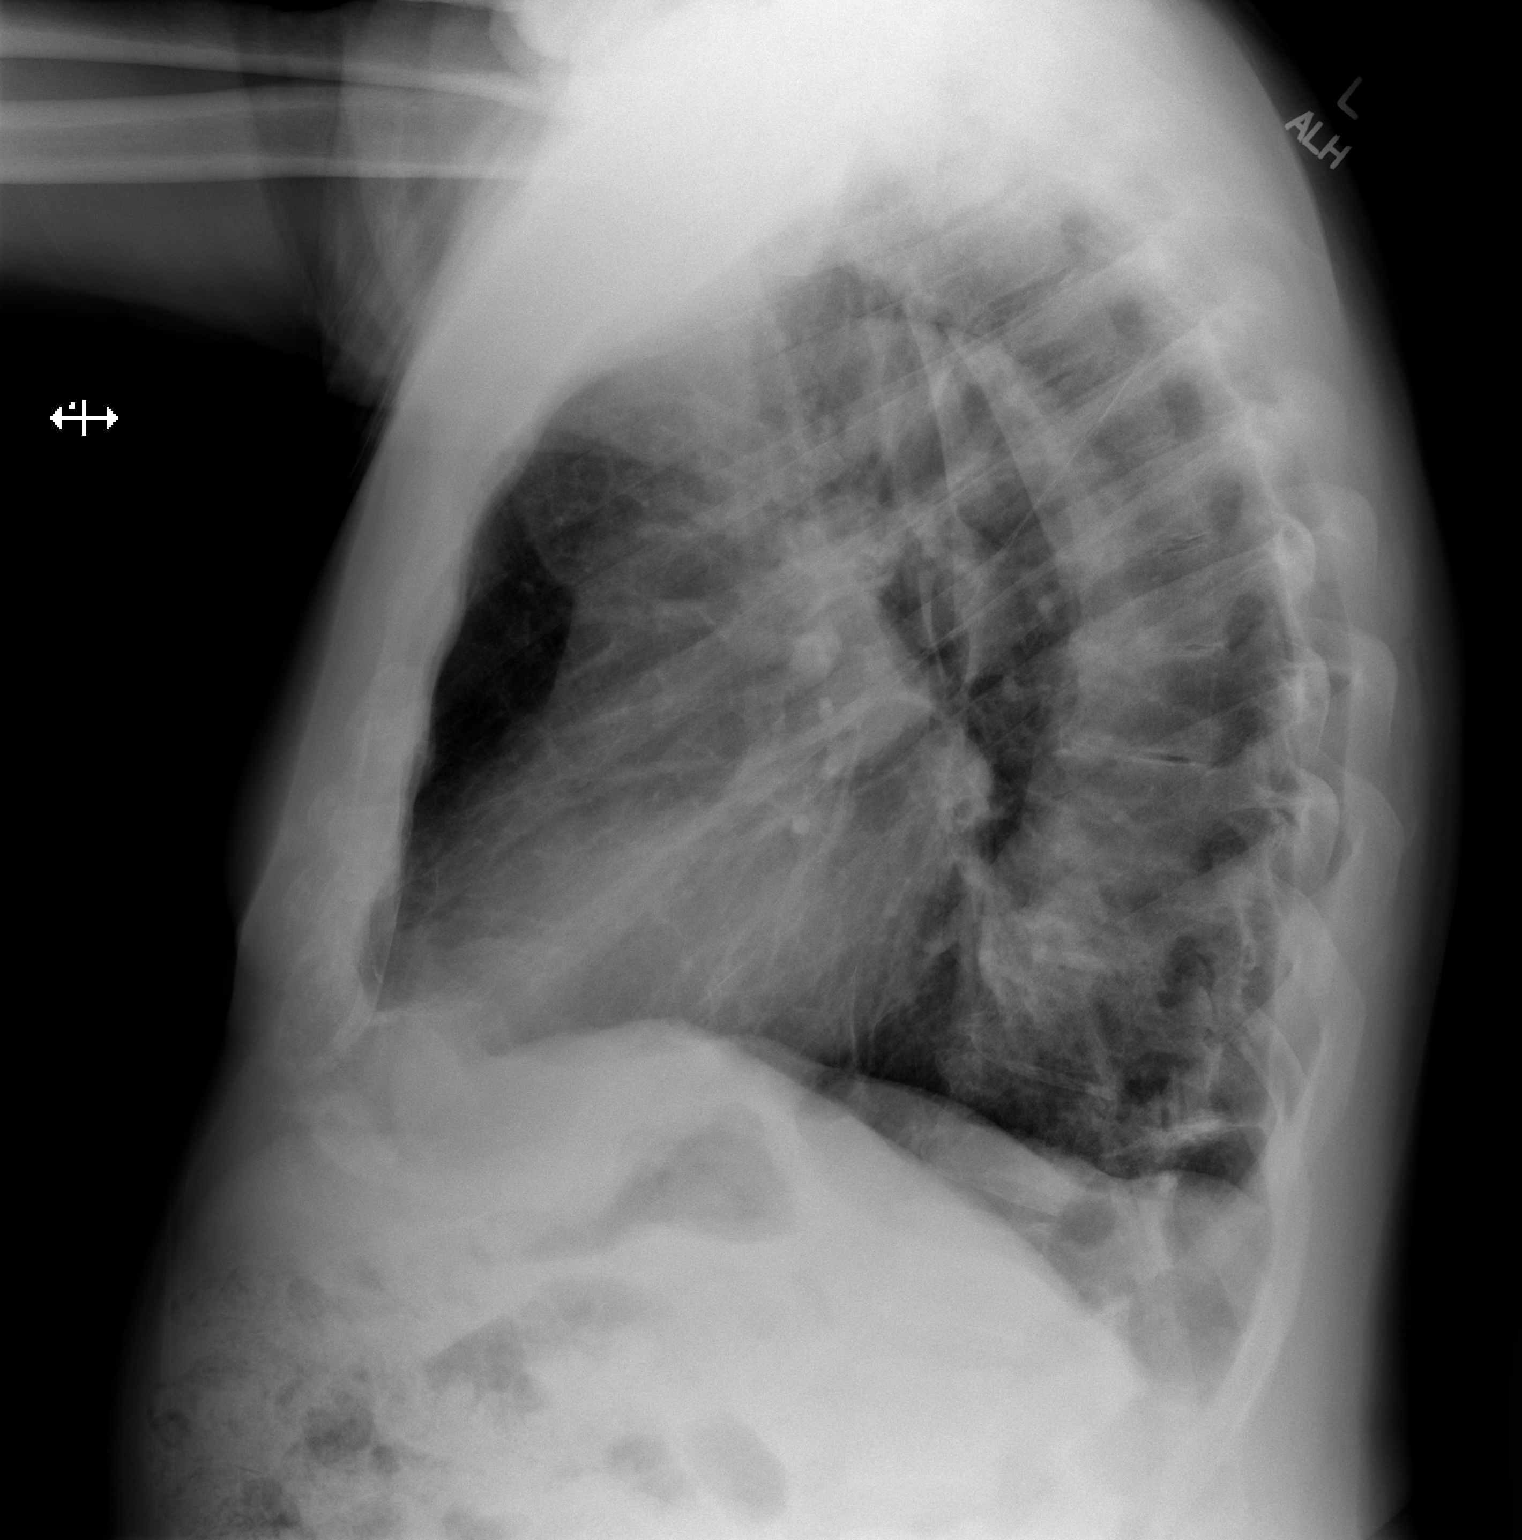

[2 of 2 positions shown; findings below may reference images not displayed]

FINDINGS: Lung volumes are normal. No consolidative airspace disease. No
pleural effusions. No pneumothorax. No pulmonary nodule or mass
noted. Pulmonary vasculature and the cardiomediastinal silhouette
are within normal limits.
IMPRESSION: 1.  No radiographic evidence of acute cardiopulmonary disease.

## 2022-02-21 ENCOUNTER — Telehealth: Payer: Self-pay

## 2022-02-21 NOTE — Telephone Encounter (Signed)
Patient calling back.   °

## 2022-02-21 NOTE — Telephone Encounter (Signed)
Spoke with patient who is agreeable to do a tele visit on 12/11 at 10:20 am. Consent done and med rec needs to be completed

## 2022-02-21 NOTE — Telephone Encounter (Signed)
  Patient Consent for Virtual Visit        Daniel Cain has provided verbal consent on 02/21/2022 for a virtual visit (video or telephone).   CONSENT FOR VIRTUAL VISIT FOR:  Daniel Cain  By participating in this virtual visit I agree to the following:  I hereby voluntarily request, consent and authorize [facility] and its employed or contracted physicians, physician assistants, nurse practitioners or other licensed health care professionals (the Practitioner), to provide me with telemedicine health care services (the "Services") as deemed necessary by the treating Practitioner. I acknowledge and consent to receive the Services by the Practitioner via telemedicine. I understand that the telemedicine visit will involve communicating with the Practitioner through live audiovisual communication technology and the disclosure of certain medical information by electronic transmission. I acknowledge that I have been given the opportunity to request an in-person assessment or other available alternative prior to the telemedicine visit and am voluntarily participating in the telemedicine visit.  I understand that I have the right to withhold or withdraw my consent to the use of telemedicine in the course of my care at any time, without affecting my right to future care or treatment, and that the Practitioner or I may terminate the telemedicine visit at any time. I understand that I have the right to inspect all information obtained and/or recorded in the course of the telemedicine visit and may receive copies of available information for a reasonable fee.  I understand that some of the potential risks of receiving the Services via telemedicine include:  Delay or interruption in medical evaluation due to technological equipment failure or disruption; Information transmitted may not be sufficient (e.g. poor resolution of images) to allow for appropriate medical decision making by the  Practitioner; and/or  In rare instances, security protocols could fail, causing a breach of personal health information.  Furthermore, I acknowledge that it is my responsibility to provide information about my medical history, conditions and care that is complete and accurate to the best of my ability. I acknowledge that Practitioner's advice, recommendations, and/or decision may be based on factors not within their control, such as incomplete or inaccurate data provided by me or distortions of diagnostic images or specimens that may result from electronic transmissions. I understand that the practice of medicine is not an exact science and that Practitioner makes no warranties or guarantees regarding treatment outcomes. I acknowledge that a copy of this consent can be made available to me via my patient portal ([facility]), or I can request a printed copy by calling the office of [facility].    I understand that my insurance will be billed for this visit.   I have read or had this consent read to me. I understand the contents of this consent, which adequately explains the benefits and risks of the Services being provided via telemedicine.  I have been provided ample opportunity to ask questions regarding this consent and the Services and have had my questions answered to my satisfaction. I give my informed consent for the services to be provided through the use of telemedicine in my medical care

## 2022-03-04 ENCOUNTER — Other Ambulatory Visit: Payer: Self-pay | Admitting: Cardiology

## 2022-03-14 DIAGNOSIS — L821 Other seborrheic keratosis: Secondary | ICD-10-CM | POA: Diagnosis not present

## 2022-03-14 DIAGNOSIS — D485 Neoplasm of uncertain behavior of skin: Secondary | ICD-10-CM | POA: Diagnosis not present

## 2022-03-14 DIAGNOSIS — C44712 Basal cell carcinoma of skin of right lower limb, including hip: Secondary | ICD-10-CM | POA: Diagnosis not present

## 2022-03-18 DIAGNOSIS — H90A21 Sensorineural hearing loss, unilateral, right ear, with restricted hearing on the contralateral side: Secondary | ICD-10-CM | POA: Diagnosis not present

## 2022-03-18 DIAGNOSIS — H8190 Unspecified disorder of vestibular function, unspecified ear: Secondary | ICD-10-CM | POA: Diagnosis not present

## 2022-03-18 DIAGNOSIS — H903 Sensorineural hearing loss, bilateral: Secondary | ICD-10-CM | POA: Diagnosis not present

## 2022-04-14 ENCOUNTER — Other Ambulatory Visit: Payer: Self-pay | Admitting: Cardiology

## 2022-04-30 ENCOUNTER — Other Ambulatory Visit: Payer: Self-pay | Admitting: Student

## 2022-04-30 DIAGNOSIS — E7800 Pure hypercholesterolemia, unspecified: Secondary | ICD-10-CM

## 2022-05-06 ENCOUNTER — Telehealth: Payer: Self-pay | Admitting: *Deleted

## 2022-05-06 ENCOUNTER — Ambulatory Visit: Payer: Medicare PPO

## 2022-05-06 NOTE — Telephone Encounter (Signed)
Left a message for the pt to call back to reschedule his tele appt for pre op clearance. Provider unavailable.

## 2022-05-06 NOTE — Telephone Encounter (Signed)
Patient is returning call.  °

## 2022-05-06 NOTE — Telephone Encounter (Signed)
I s/w the pt and he is agreeable to move tele pre op appt to 05/13/22 @ 2:40. Provider today was unavailable.

## 2022-05-07 ENCOUNTER — Other Ambulatory Visit: Payer: Self-pay | Admitting: Adult Health

## 2022-05-07 DIAGNOSIS — H8190 Unspecified disorder of vestibular function, unspecified ear: Secondary | ICD-10-CM

## 2022-05-08 ENCOUNTER — Encounter: Payer: Self-pay | Admitting: Adult Health

## 2022-05-08 MED ORDER — OXCARBAZEPINE 300 MG PO TABS: ORAL_TABLET | ORAL | 0 refills | Status: DC

## 2022-05-08 NOTE — Telephone Encounter (Signed)
During last visit 05/31/21, you planned to Decrease oxcarbazepine to 600mg  twice daily - will consider further tapering after follow up with [facility]. Do you want him to d/c or taper off now ?

## 2022-05-13 ENCOUNTER — Ambulatory Visit: Payer: Medicare PPO | Attending: Cardiology | Admitting: Student

## 2022-05-13 DIAGNOSIS — Z01810 Encounter for preprocedural cardiovascular examination: Secondary | ICD-10-CM | POA: Diagnosis not present

## 2022-05-13 NOTE — Progress Notes (Signed)
Virtual Visit via Telephone Note   Because of Daniel Cain's co-morbid illnesses, he is at least at moderate risk for complications without adequate follow up.  This format is felt to be most appropriate for this patient at this time.  The patient did not have access to video technology/had technical difficulties with video requiring transitioning to audio format only (telephone).  All issues noted in this document were discussed and addressed.  No physical exam could be performed with this format.  Please refer to the patient's chart for his consent to telehealth for [facility].  Evaluation Performed:  Preoperative Cardiovascular Risk Assessment _____________   Date:  05/13/2022   Patient ID:  Daniel Cain, DOB [birth year], MRN [id number] Patient Location:  Home Provider location:   Office  Primary Care Provider:  Soundra Pilon, FNP Primary Cardiologist:  None  Chief Complaint / Patient Profile   Daniel Cain is a 81 y.o. year old male with a history of CAD noted on coronary CTA in 02/2020, non-ischemic cardiomyopathy with EF as low as 25-30% on Echo in 07/2019 but improved to 40% in 11/2020, paroxysmal atrial fibrillation on Eliquis, LBBB, hypertension, hyperlipidemia, and obstructive sleep apnea who is pending a left hip revision and presents today for telephonic preoperative cardiovascular risk assessment.  Past Medical History    Past Medical History:  Diagnosis Date   Atrial fibrillation (HCC)    Bladder calculi    Cardiomyopathy (HCC)    Hyperlipidemia    Hypertension    Left bundle branch block    Nocturia    Osteoarthritis    Sleep apnea    Vertigo    Past Surgical History:  Procedure Laterality Date   CARDIOVERSION N/A 09/01/2019   Procedure: CARDIOVERSION;  Surgeon: Little Ishikawa, MD;  Location[facility] ENDOSCOPY;  Service: Cardiovascular;  Laterality: N/A;   CARDIOVERSION N/A 11/08/2019   Procedure: CARDIOVERSION;  Surgeon: Sande Rives, MD;  Location: [facility] ENDOSCOPY;  Service: Cardiovascular;  Laterality: N/A;   CYSTOSCOPY WITH LITHOLAPAXY N/A 03/08/2021   Procedure: CYSTOSCOPY WITH LITHOLAPAXY;  Surgeon: Marcine Matar, MD;  Location: [facility] ORS;  Service: Urology;  Laterality: N/A;   HERNIA REPAIR     TOTAL HIP ARTHROPLASTY Bilateral    TRANSURETHRAL INCISION OF PROSTATE N/A 03/08/2021   Procedure: TRANSURETHRAL INCISION OF THE PROSTATE (TUIP);  Surgeon: Marcine Matar, MD;  Location: [facility] ORS;  Service: Urology;  Laterality: N/A;    Allergies  No Known Allergies  History of Present Illness    Daniel Cain is a 81 y.o. male who presents via audio/video conferencing for a telehealth visit today.  Patient was last seen in our office on 08/28/2021 by Dr. Jens Som.  At that time, he was doing well with no chest pain or dyspnea.  He is now pending procedure as outlined above. Since his last visit, he has been doing well from a cardiac standpoint. He still has some mild shortness of breath when he is walking uphill but otherwise no dyspnea. He denies any shortness of breath at rest or when walking on a flat surface. He has been going to the gym and riding a stationary bike and is able to do this without any problems. No chest pain, orthopnea, PND, palpitations, significant dizziness, or syncope. He is able to complete >4.0 METS.   Home Medications    Prior to Admission medications   Medication Sig Start Date End Date Taking? Authorizing Provider  alfuzosin (UROXATRAL) 10 MG 24 hr  tablet Take 10 mg by mouth daily with breakfast.    [provider]  amiodarone (PACERONE) 200 MG tablet TAKE 1 TABLET BY MOUTH DAILY. 11/16/21   [name], MD  apixaban (ELIQUIS) 5 MG TABS tablet Take 1 tablet (5 mg total) by mouth 2 (two) times daily. 06/18/21   Bhagat, Sharrell Ku, PA  atorvastatin (LIPITOR) 80 MG tablet Take 1 tablet (80 mg total) by mouth daily. 03/10/21   Iran Ouch, Lennart Pall, PA-C  Betahistine HCl  (BETAHISTINE DIHYDROCHLORIDE) POWD 24mg  tablet PO twice a day 05/07/21   [provider]  carvedilol (COREG) 12.5 MG tablet Take 12.5 mg by mouth 2 (two) times daily with a meal.    [provider]  carvedilol (COREG) 6.25 MG tablet TAKE ONE TABLET BY MOUTH TWICE DAILY AFTER MEALS 10/03/21   Crenshaw, 12/03/21, MD  ENTRESTO 97-103 MG TAKE 1 TABLET BY MOUTH TWICE DAILY. 04/15/22   04/17/22, MD  ezetimibe (ZETIA) 10 MG tablet TAKE ONE TABLET EVERY DAY 05/01/22   14/6/23, MD  hydrALAZINE (APRESOLINE) 25 MG tablet Take 1 tablet (25 mg total) by mouth 2 (two) times daily. 03/04/22   Strader, 05/04/22, PA-C  meclizine (ANTIVERT) 25 MG tablet Take 25 mg by mouth 4 (four) times daily as needed. 02/07/21   [provider]  Oxcarbazepine (TRILEPTAL) 300 MG tablet 300mg  AM and 600mg  PM for 2 weeks; 300mg  twice daily for 2 weeks; 300mg  nightly for 2 weeks; stop medication 05/08/22   , NP  sildenafil (VIAGRA) 100 MG tablet Take 100 mg by mouth daily as needed for erectile dysfunction. 10/19/19   [provider]  sulfamethoxazole-trimethoprim (BACTRIM DS) 800-160 MG tablet Take 1 tablet by mouth 2 (two) times daily. 03/08/21   , MD    Physical Exam    Vital Signs:  05/10/22 does not have vital signs available for review today.  Given telephonic nature of communication, physical exam is limited. Alert and oriented x3. No acute distress. Normal affect. Speech and respirations are unlabored.  Accessory Clinical Findings    None  Assessment & Plan    Preoperative Cardiovascular Risk Assessment: Patient has an upcoming hip surgery planned. He is stable from a cardiac standpoint as described above. He is able to complete >4.0 METS without any chest pain or significant shortness of breath. Per Revised Cardiac Risk Index Ihor Austin Criteria), considered moderate risk given history of CAD and NICM. Therefore, based on ACC/AHA  guidelines, patient would be at acceptable risk for the planned procedure without further cardiovascular testing. Per Pharmacy and office protocol, OK to hold Eliquis for 3 days prior to procedure. Please restart this as soon as safely possible afterwards. I will route this recommendation to the requesting party via Epic fax function.   A copy of this note will be routed to requesting surgeon.  Time:   Today, I have spent 12 minutes with the patient with telehealth technology discussing medical history, symptoms, and management plan.     10/21/19, PA-C  05/13/2022, 2:56 PM

## 2022-05-15 DIAGNOSIS — Z6830 Body mass index (BMI) 30.0-30.9, adult: Secondary | ICD-10-CM | POA: Diagnosis not present

## 2022-05-15 DIAGNOSIS — I447 Left bundle-branch block, unspecified: Secondary | ICD-10-CM | POA: Diagnosis not present

## 2022-05-15 DIAGNOSIS — I440 Atrioventricular block, first degree: Secondary | ICD-10-CM | POA: Diagnosis not present

## 2022-05-15 DIAGNOSIS — Z79899 Other long term (current) drug therapy: Secondary | ICD-10-CM | POA: Diagnosis not present

## 2022-05-15 DIAGNOSIS — R001 Bradycardia, unspecified: Secondary | ICD-10-CM | POA: Diagnosis not present

## 2022-05-15 DIAGNOSIS — Z01818 Encounter for other preprocedural examination: Secondary | ICD-10-CM | POA: Diagnosis not present

## 2022-05-22 DIAGNOSIS — E7800 Pure hypercholesterolemia, unspecified: Secondary | ICD-10-CM | POA: Diagnosis not present

## 2022-05-22 DIAGNOSIS — Z87891 Personal history of nicotine dependence: Secondary | ICD-10-CM | POA: Diagnosis not present

## 2022-05-22 DIAGNOSIS — Z7901 Long term (current) use of anticoagulants: Secondary | ICD-10-CM | POA: Diagnosis not present

## 2022-05-22 DIAGNOSIS — I10 Essential (primary) hypertension: Secondary | ICD-10-CM | POA: Diagnosis not present

## 2022-05-22 DIAGNOSIS — T8484XA Pain due to internal orthopedic prosthetic devices, implants and grafts, initial encounter: Secondary | ICD-10-CM | POA: Diagnosis not present

## 2022-05-22 DIAGNOSIS — T84051A Periprosthetic osteolysis of internal prosthetic left hip joint, initial encounter: Secondary | ICD-10-CM | POA: Diagnosis not present

## 2022-05-22 DIAGNOSIS — Z96642 Presence of left artificial hip joint: Secondary | ICD-10-CM | POA: Diagnosis not present

## 2022-05-22 DIAGNOSIS — T84091A Other mechanical complication of internal left hip prosthesis, initial encounter: Secondary | ICD-10-CM | POA: Diagnosis not present

## 2022-05-22 DIAGNOSIS — M67852 Other specified disorders of synovium, left hip: Secondary | ICD-10-CM | POA: Diagnosis not present

## 2022-06-03 DIAGNOSIS — Z96642 Presence of left artificial hip joint: Secondary | ICD-10-CM | POA: Diagnosis not present

## 2022-06-03 DIAGNOSIS — Z471 Aftercare following joint replacement surgery: Secondary | ICD-10-CM | POA: Diagnosis not present

## 2022-06-10 ENCOUNTER — Other Ambulatory Visit (HOSPITAL_BASED_OUTPATIENT_CLINIC_OR_DEPARTMENT_OTHER): Payer: Self-pay

## 2022-06-10 MED ORDER — COMIRNATY 30 MCG/0.3ML IM SUSY
PREFILLED_SYRINGE | INTRAMUSCULAR | 0 refills | Status: DC
  Filled 2022-06-10: qty 0.3, 1d supply, fill #0

## 2022-06-12 ENCOUNTER — Other Ambulatory Visit (HOSPITAL_BASED_OUTPATIENT_CLINIC_OR_DEPARTMENT_OTHER): Payer: Self-pay

## 2022-06-19 DIAGNOSIS — M25552 Pain in left hip: Secondary | ICD-10-CM | POA: Diagnosis not present

## 2022-06-20 DIAGNOSIS — M205X1 Other deformities of toe(s) (acquired), right foot: Secondary | ICD-10-CM | POA: Diagnosis not present

## 2022-06-20 DIAGNOSIS — B351 Tinea unguium: Secondary | ICD-10-CM | POA: Diagnosis not present

## 2022-06-20 DIAGNOSIS — M205X2 Other deformities of toe(s) (acquired), left foot: Secondary | ICD-10-CM | POA: Diagnosis not present

## 2022-06-20 DIAGNOSIS — R262 Difficulty in walking, not elsewhere classified: Secondary | ICD-10-CM | POA: Diagnosis not present

## 2022-06-21 DIAGNOSIS — M25552 Pain in left hip: Secondary | ICD-10-CM | POA: Diagnosis not present

## 2022-06-24 DIAGNOSIS — M25552 Pain in left hip: Secondary | ICD-10-CM | POA: Diagnosis not present

## 2022-06-26 DIAGNOSIS — M25552 Pain in left hip: Secondary | ICD-10-CM | POA: Diagnosis not present

## 2022-07-01 DIAGNOSIS — Z96642 Presence of left artificial hip joint: Secondary | ICD-10-CM | POA: Diagnosis not present

## 2022-07-01 DIAGNOSIS — Z471 Aftercare following joint replacement surgery: Secondary | ICD-10-CM | POA: Diagnosis not present

## 2022-07-02 DIAGNOSIS — H40023 Open angle with borderline findings, high risk, bilateral: Secondary | ICD-10-CM | POA: Diagnosis not present

## 2022-07-03 DIAGNOSIS — M25552 Pain in left hip: Secondary | ICD-10-CM | POA: Diagnosis not present

## 2022-07-08 DIAGNOSIS — M25552 Pain in left hip: Secondary | ICD-10-CM | POA: Diagnosis not present

## 2022-07-10 DIAGNOSIS — I10 Essential (primary) hypertension: Secondary | ICD-10-CM | POA: Diagnosis not present

## 2022-07-10 DIAGNOSIS — I4891 Unspecified atrial fibrillation: Secondary | ICD-10-CM | POA: Diagnosis not present

## 2022-07-10 DIAGNOSIS — N401 Enlarged prostate with lower urinary tract symptoms: Secondary | ICD-10-CM | POA: Diagnosis not present

## 2022-07-10 DIAGNOSIS — E782 Mixed hyperlipidemia: Secondary | ICD-10-CM | POA: Diagnosis not present

## 2022-07-10 DIAGNOSIS — Z23 Encounter for immunization: Secondary | ICD-10-CM | POA: Diagnosis not present

## 2022-07-10 DIAGNOSIS — D6869 Other thrombophilia: Secondary | ICD-10-CM | POA: Diagnosis not present

## 2022-07-10 DIAGNOSIS — Z125 Encounter for screening for malignant neoplasm of prostate: Secondary | ICD-10-CM | POA: Diagnosis not present

## 2022-07-10 DIAGNOSIS — I700 Atherosclerosis of aorta: Secondary | ICD-10-CM | POA: Diagnosis not present

## 2022-07-10 DIAGNOSIS — Z0000 Encounter for general adult medical examination without abnormal findings: Secondary | ICD-10-CM | POA: Diagnosis not present

## 2022-07-10 DIAGNOSIS — M25552 Pain in left hip: Secondary | ICD-10-CM | POA: Diagnosis not present

## 2022-07-11 DIAGNOSIS — Z125 Encounter for screening for malignant neoplasm of prostate: Secondary | ICD-10-CM | POA: Diagnosis not present

## 2022-07-11 DIAGNOSIS — I4891 Unspecified atrial fibrillation: Secondary | ICD-10-CM | POA: Diagnosis not present

## 2022-07-11 DIAGNOSIS — D6869 Other thrombophilia: Secondary | ICD-10-CM | POA: Diagnosis not present

## 2022-07-11 DIAGNOSIS — Z0000 Encounter for general adult medical examination without abnormal findings: Secondary | ICD-10-CM | POA: Diagnosis not present

## 2022-07-11 DIAGNOSIS — E871 Hypo-osmolality and hyponatremia: Secondary | ICD-10-CM | POA: Diagnosis not present

## 2022-07-11 DIAGNOSIS — E782 Mixed hyperlipidemia: Secondary | ICD-10-CM | POA: Diagnosis not present

## 2022-07-11 DIAGNOSIS — N401 Enlarged prostate with lower urinary tract symptoms: Secondary | ICD-10-CM | POA: Diagnosis not present

## 2022-07-11 DIAGNOSIS — I10 Essential (primary) hypertension: Secondary | ICD-10-CM | POA: Diagnosis not present

## 2022-07-15 DIAGNOSIS — M25552 Pain in left hip: Secondary | ICD-10-CM | POA: Diagnosis not present

## 2022-07-17 DIAGNOSIS — M25552 Pain in left hip: Secondary | ICD-10-CM | POA: Diagnosis not present

## 2022-07-19 DIAGNOSIS — N400 Enlarged prostate without lower urinary tract symptoms: Secondary | ICD-10-CM | POA: Diagnosis not present

## 2022-07-22 DIAGNOSIS — M25552 Pain in left hip: Secondary | ICD-10-CM | POA: Diagnosis not present

## 2022-07-23 ENCOUNTER — Other Ambulatory Visit: Payer: Self-pay

## 2022-07-23 MED ORDER — APIXABAN 5 MG PO TABS: 5.0000 mg | ORAL_TABLET | Freq: Two times a day (BID) | ORAL | 1 refills | Status: DC

## 2022-07-23 NOTE — Telephone Encounter (Signed)
Pt last saw Dr Stanford Breed 08/28/21, last labs 05/15/22 Creat 0.91, age 82, weight 95.1kg, based on specified criteria pt is on appropriate dosage of Eliquis '5mg'$  BID for afib.  Will refill rx.

## 2022-07-25 DIAGNOSIS — M25552 Pain in left hip: Secondary | ICD-10-CM | POA: Diagnosis not present

## 2022-07-29 DIAGNOSIS — M25552 Pain in left hip: Secondary | ICD-10-CM | POA: Diagnosis not present

## 2022-07-31 DIAGNOSIS — M25552 Pain in left hip: Secondary | ICD-10-CM | POA: Diagnosis not present

## 2022-08-05 DIAGNOSIS — M25552 Pain in left hip: Secondary | ICD-10-CM | POA: Diagnosis not present

## 2022-08-08 DIAGNOSIS — M25552 Pain in left hip: Secondary | ICD-10-CM | POA: Diagnosis not present

## 2022-08-12 DIAGNOSIS — M25552 Pain in left hip: Secondary | ICD-10-CM | POA: Diagnosis not present

## 2022-08-14 DIAGNOSIS — M25552 Pain in left hip: Secondary | ICD-10-CM | POA: Diagnosis not present

## 2022-08-16 DIAGNOSIS — M25552 Pain in left hip: Secondary | ICD-10-CM | POA: Diagnosis not present

## 2022-08-19 DIAGNOSIS — M25552 Pain in left hip: Secondary | ICD-10-CM | POA: Diagnosis not present

## 2022-08-19 DIAGNOSIS — Z96642 Presence of left artificial hip joint: Secondary | ICD-10-CM | POA: Diagnosis not present

## 2022-08-19 DIAGNOSIS — Z471 Aftercare following joint replacement surgery: Secondary | ICD-10-CM | POA: Diagnosis not present

## 2022-08-21 DIAGNOSIS — M25552 Pain in left hip: Secondary | ICD-10-CM | POA: Diagnosis not present

## 2022-08-22 NOTE — Progress Notes (Signed)
NEUROLOGY CLINIC FOLLOW UP PATIENT NOTE  NAME: Daniel Cain DOB: 09-28-1940    Chief complaint: Chief Complaint  Patient presents with   Follow-up    Patient in room #8 and alone. Patient states he is well and stable, no new concerns.     HPI:  Update 08/26/2022 JM: Patient returns for follow-up visit unaccompanied.  He has since completely tapered off oxcarbazepine over the past 2 months.  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  Reports symptoms stable without any recent vertigo episodes.  Continues to follow with ENT Dr. Amado Coe. Balance has been stable. Did have a left hip revision back in December, has been gradually recovering, currently working with PT and use of cane.  No questions or concerns at this time.     History provided for reference purposes only Update 05/31/2021 JM: Patient returns after prior visit 2 months ago accompanied by his wife,.  He has since been seen by [facility]NT for second opinion regarding vertigo episodes. Personally reviewed Dr. Tristan Schroeder note - sx consistent with Mnire's disease but no aural sx of worsening hearing loss, tinnitus or aural fullness during episodes thus felt likely episodic vertigo.  Trialing Mnire's treatment with betahistine for 3 months, started ~3 wks ago. F/u planned March for vestibular testing. 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  Doing well since prior visit - denies any additional vertigo episodes. Tolerating betahistine well. Questions ongoing need of oxcarbazepine. Reports worsening left hip pain with plans on revision in near future. No further concerns at this time.     MR BRAIN W/WO CONTRAST 04/23/2021 IMPRESSION:  -Mild atrophy and mild chronic small vessel ischemic disease. 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 -No acute findings.     Update 03/28/2021 JM: Returns for acute visit due to increased frequency of vertigo episodes.  He was previously seen 8 months ago for episodes of vertigo (lightheadedness, dizziness, N/V and imbalance) of unknown etiology (Ddx vestibular paroxysmia vs basilar type migraine vs mid pontine small capillary telangectasia related per Dr. Rhunette Croft)   Concerned regarding increased episodes of vertigo. Since prior visit, he had additional event 02/06/2021 which lasted a total of 3 days -symptoms worse within the first 12 hours but persistent (difficulty ambulating) for almost 72 hours. This event subsided 2-3 hours after dose of meclizine.  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 No other changes in symptoms or characteristics - denies LOC, AMS, weakness, numbness/tingling, speech difficulty  He has remained on Trileptal 600/900 without any missed dosages -denies side effects  He questions possible need of evaluation at teaching hospital    History of events:  05/2014 (onset) 06/2014 - episode 01/2015 - episode 11/9-11/16/2016 - recurrent episodes 04/15/2015 -episodes  04/27/2015 - Dr. Erlinda Hong eval (as below)  05/04/2015 - episode  05/12/2015 - Dr. Erlinda Hong f/u visit -low-dose Trileptal initiated  06/2015 -episode -Trileptal dosage increased to 600mg  BID 09/2019 -episode after 4 years -Trileptal dosage increased to 600mg  AM and 900mg  PM  05/2020 - episode after 1 year -no dosage adjustments made 02/06/2021 - episode lasting 3 days - resolved after meclizine (see above) 03/25/2021 - lasted 20 min 03/26/2021 - lasted 20  min    Initial eval by Dr. 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 No  evidence of central lesion. 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 Lucia Gaskins questionable Mnire's disease -advised low-salt diet and diuretics        ROS: Review of Systems  Constitutional: Negative.   HENT:  Positive for hearing loss. Negative for tinnitus.   Eyes: Negative.   Respiratory: Negative.    Cardiovascular: Negative.   Genitourinary: Negative.   Musculoskeletal: Negative.   Skin: Negative.   Neurological:  Positive for dizziness.  Endo/Heme/Allergies: Negative.   Psychiatric/Behavioral: Negative.         Past Medical History:  Diagnosis Date   Atrial fibrillation ([place])    Bladder calculi    Cardiomyopathy ([place])    Hyperlipidemia    Hypertension    Left bundle branch block    Nocturia    Osteoarthritis    Sleep apnea    Vertigo    Past Surgical History:  Procedure Laterality Date   CARDIOVERSION N/A 09/01/2019   Procedure: CARDIOVERSION;  Surgeon: Donato Heinz, MD;  Location: [facility] ENDOSCOPY;  Service: Cardiovascular;  Laterality: N/A;   CARDIOVERSION N/A 11/08/2019   Procedure: CARDIOVERSION;  Surgeon: Geralynn Rile, MD;  Location: [facility];  Service: Cardiovascular;  Laterality: N/A;   CYSTOSCOPY WITH LITHOLAPAXY N/A 03/08/2021   Procedure: CYSTOSCOPY WITH LITHOLAPAXY;  Surgeon: Franchot Gallo, MD;  Location: [facility] ORS;  Service: Urology;  Laterality: N/A;   HERNIA REPAIR     TOTAL HIP ARTHROPLASTY Bilateral    TRANSURETHRAL INCISION OF PROSTATE N/A 03/08/2021   Procedure: TRANSURETHRAL INCISION OF THE PROSTATE (TUIP);  Surgeon: Franchot Gallo, MD;  Location: [facility] ORS;  Service: Urology;  Laterality: N/A;   Family History  Problem Relation Age of Onset   COPD Mother    Stroke Mother     Hypertension Father    Current Outpatient Medications  Medication Sig Dispense Refill   alfuzosin (UROXATRAL) 10 MG 24 hr tablet Take 10 mg by mouth daily with breakfast.     amiodarone (PACERONE) 200 MG tablet TAKE 1 TABLET BY MOUTH DAILY. 90 tablet 3   apixaban (ELIQUIS) 5 MG TABS tablet Take 1 tablet (5 mg total) by mouth 2 (two) times daily. 180 tablet 1   atorvastatin (LIPITOR) 80 MG tablet Take 1 tablet (80 mg total) by mouth daily. 3 tablet 0   Betahistine HCl (BETAHISTINE DIHYDROCHLORIDE) POWD 24mg  tablet PO twice a day     carvedilol (COREG) 12.5 MG tablet Take 12.5 mg by mouth 2 (two) times daily with a meal.     carvedilol (COREG) 6.25 MG tablet TAKE ONE TABLET BY MOUTH TWICE DAILY AFTER MEALS 180 tablet 3   COVID-19 mRNA vaccine 2023-2024 (COMIRNATY) syringe Inject into the muscle. 0.3 mL 0   ENTRESTO 97-103 MG TAKE 1 TABLET BY MOUTH TWICE DAILY. 60 tablet 11   ezetimibe (ZETIA) 10 MG tablet TAKE ONE TABLET EVERY DAY 90 tablet 3   hydrALAZINE (APRESOLINE) 25 MG tablet Take 1 tablet (25 mg total) by mouth 2 (two) times daily. 180 tablet 3   meclizine (ANTIVERT) 25 MG tablet Take 25 mg by mouth 4 (four) times daily as needed.     sildenafil (VIAGRA)  100 MG tablet Take 100 mg by mouth daily as needed for erectile dysfunction.     sulfamethoxazole-trimethoprim (BACTRIM DS) 800-160 MG tablet Take 1 tablet by mouth 2 (two) times daily. 6 tablet 0   Oxcarbazepine (TRILEPTAL) 300 MG tablet 300mg  AM and 600mg  PM for 2 weeks; 300mg  twice daily for 2 weeks; 300mg  nightly for 2 weeks; stop medication (Patient not taking: Reported on 08/26/2022) 84 tablet 0   No current facility-administered medications for this visit.   No Known Allergies Social History   Socioeconomic History   Marital status: Married    Spouse name: Not on file   Number of children: 3   Years of education: Not on file   Highest education level: Not on file  Occupational History   Not on file  Tobacco Use   Smoking  status: Former   Smokeless tobacco: Never  Vaping Use   Vaping Use: Never used  Substance and Sexual Activity   Alcohol use: Yes    Alcohol/week: 1.0 standard drink of alcohol    Types: 1 Glasses of wine per week    Comment: 1-2 glasses wine per day   Drug use: No   Sexual activity: Never    Birth control/protection: Abstinence  Other Topics Concern   Not on file  Social History Narrative   Not on file   Social Determinants of Health   Financial Resource Strain: Not on file  Food Insecurity: Not on file  Transportation Needs: Not on file  Physical Activity: Not on file  Stress: Not on file  Social Connections: Not on file  Intimate Partner Violence: Not on file     Physical Exam  Today's Vitals   08/26/22 0808  BP: 131/71  Pulse: 60  Weight: 201 lb (91.2 kg)  Height: 6' (1.829 m)   Body mass index is 27.26 kg/m.   General - Well nourished, well developed, very pleasant elderly Caucasian male, in no apparent distress. Cardiovascular -regular rate and rhythm  Mental Status -  Level of arousal and orientation to time, place, and person were intact. Language including expression, naming, repetition, comprehension, reading, and writing was assessed and found intact. Attention span and concentration were normal. Recent and remote memory were intact. Fund of Knowledge was assessed and was intact.  Cranial Nerves II - XII - II - Visual field intact OU. III, IV, VI - Extraocular movements intact. V - Facial sensation intact bilaterally. VII - Facial movement intact bilaterally. VIII - HOH R>L X - Palate elevates symmetrically. XI - Chin turning & shoulder shrug intact bilaterally. XII - Tongue protrusion intact. Motor Strength - The patient's strength was normal in all extremities and pronator drift was absent.  Bulk was normal and fasciculations were absent.   Motor Tone - Muscle tone was assessed at the neck and appendages and was normal. Reflexes - The  patient's reflexes were normal in all extremities and he had no pathological reflexes. Sensory - Light touch, temperature/pinprick, vibration and proprioception, and Romberg testing were assessed and were normal.   Coordination - The patient had normal movements in the hands and feet with no ataxia or dysmetria.  Tremor was absent. 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 Erlinda Hong. Additional episode 06/2015 with Trileptal dosage increased to 600 mg twice daily without recurrent episodes until 05/2019 and 09/2019 with increase Trileptal dosage. Recurrent events: 05/2020 (Trileptal dosage increased), 02/06/2021, 03/25/2021 and 03/26/2021. Dx'd atrial fibrillation in 06/2019 s/p cardioversion 08/2019 with repeat  cardioversion 10/2019.  Eval by [facility] Dr. 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 He will continue to follow with ENT at this point for symptom and medication management. Can f/u here as needed.      CC:  Kristen Loader, FNP   [facility]: Dr. Amado Coe   I spent 16 minutes of face-to-face and non-face-to-face time with patient.  This included previsit chart review, lab review, study review, order entry, electronic health record documentation, patient education and discussion regarding above diagnoses and treatment plan and answered all other questions to patient's  satisfaction  Frann Rider, [facility]  [facility] [address] [place], [place] [address]  Phone [phone] Fax [phone] Note: This document was prepared with digital dictation and possible smart phrase technology. Any transcriptional errors that result from this process are unintentional.

## 2022-08-26 ENCOUNTER — Ambulatory Visit (INDEPENDENT_AMBULATORY_CARE_PROVIDER_SITE_OTHER): Payer: Medicare PPO | Admitting: Adult Health

## 2022-08-26 ENCOUNTER — Encounter: Payer: Self-pay | Admitting: Adult Health

## 2022-08-26 VITALS — BP 131/71 | HR 60 | Ht 72.0 in | Wt 201.0 lb

## 2022-08-26 DIAGNOSIS — H8190 Unspecified disorder of vestibular function, unspecified ear: Secondary | ICD-10-CM

## 2022-08-26 NOTE — Patient Instructions (Addendum)
Your Plan:  Continue to follow with ENT for ongoing management and medication management    Follow up here as needed     Thank you for coming to see [place] at [facility]. I hope we have been able to provide you high quality care today.  You may receive a patient satisfaction survey over the next few weeks. We would appreciate your feedback and comments so that we may continue to improve ourselves and the health of our patients.

## 2022-08-27 DIAGNOSIS — M25552 Pain in left hip: Secondary | ICD-10-CM | POA: Diagnosis not present

## 2022-09-02 DIAGNOSIS — M25552 Pain in left hip: Secondary | ICD-10-CM | POA: Diagnosis not present

## 2022-09-04 DIAGNOSIS — M25552 Pain in left hip: Secondary | ICD-10-CM | POA: Diagnosis not present

## 2022-09-09 DIAGNOSIS — M25552 Pain in left hip: Secondary | ICD-10-CM | POA: Diagnosis not present

## 2022-09-09 DIAGNOSIS — Z85828 Personal history of other malignant neoplasm of skin: Secondary | ICD-10-CM | POA: Diagnosis not present

## 2022-09-09 DIAGNOSIS — L821 Other seborrheic keratosis: Secondary | ICD-10-CM | POA: Diagnosis not present

## 2022-09-11 DIAGNOSIS — M25552 Pain in left hip: Secondary | ICD-10-CM | POA: Diagnosis not present

## 2022-09-16 DIAGNOSIS — M25552 Pain in left hip: Secondary | ICD-10-CM | POA: Diagnosis not present

## 2022-09-16 NOTE — Progress Notes (Signed)
HPI: FU atrial fibrillation. Cardiac catheterization April 2003 showed normal coronary arteries and ejection fraction 45 to 50%.  Nuclear study April 2016 showed ejection fraction 66%, artifact suggestive of left bundle branch block but no ischemia. Patient previously seen by Dr. Clarene [facility] with complaints of increased dyspnea on exertion.  Electrocardiogram showed atrial fibrillation.  Echocardiogram March 2021 showed ejection fraction 25 to 30%, mild RV dysfunction and mild right ventricular enlargement, severe biatrial enlargement, mild to moderate mitral regurgitation and tricuspid regurgitation and mildly dilated aortic root.  Abdominal ultrasound March 2021 showed no aneurysm. Patient had successful cardioversion on September 01, 2019. At time of follow-up he was back in atrial fibrillation.  We initiated amiodarone and he underwent cardioversion successfully June 2021. Cardiac CTA October 2021 showed calcium score 326, 50 to 69% mid LAD stenosis, 25 to 49% ramus intermedius stenosis and diminutive or possibly occluded left circumflex.  FFR 0.76 in the distal LAD suggestive of flow-limiting stenosis.  Echocardiogram July 2022 showed ejection fraction 40%, dyssynchrony due to left bundle branch block, grade 2 diastolic dysfunction, mild RV dysfunction, severe left atrial enlargement, mild right atrial enlargement and mildly dilated aortic root at 42 mm.  Since last seen patient denies dyspnea, chest pain, palpitations or syncope.  Occasional edema in left ankle following recent hip surgery.  Current Outpatient Medications  Medication Sig Dispense Refill   alfuzosin (UROXATRAL) 10 MG 24 hr tablet Take 10 mg by mouth daily with breakfast.     amiodarone (PACERONE) 200 MG tablet TAKE 1 TABLET BY MOUTH DAILY. 90 tablet 3   apixaban (ELIQUIS) 5 MG TABS tablet Take 1 tablet (5 mg total) by mouth 2 (two) times daily. 180 tablet 1   atorvastatin (LIPITOR) 80 MG tablet Take 1 tablet (80 mg total) by mouth daily. 3  tablet 0   Betahistine HCl (BETAHISTINE DIHYDROCHLORIDE) POWD 24mg  tablet PO twice a day     carvedilol (COREG) 12.5 MG tablet Take 12.5 mg by mouth 2 (two) times daily with a meal.     carvedilol (COREG) 6.25 MG tablet TAKE ONE TABLET BY MOUTH TWICE DAILY AFTER MEALS 180 tablet 3   COVID-19 mRNA vaccine 2023-2024 (COMIRNATY) syringe Inject into the muscle. 0.3 mL 0   ENTRESTO 97-103 MG TAKE 1 TABLET BY MOUTH TWICE DAILY. 60 tablet 11   ezetimibe (ZETIA) 10 MG tablet TAKE ONE TABLET EVERY DAY 90 tablet 3   hydrALAZINE (APRESOLINE) 25 MG tablet Take 1 tablet (25 mg total) by mouth 2 (two) times daily. 180 tablet 3   meclizine (ANTIVERT) 25 MG tablet Take 25 mg by mouth 4 (four) times daily as needed.     sildenafil (VIAGRA) 100 MG tablet Take 100 mg by mouth daily as needed for erectile dysfunction.     sulfamethoxazole-trimethoprim (BACTRIM DS) 800-160 MG tablet Take 1 tablet by mouth 2 (two) times daily. (Patient not taking: Reported on 09/23/2022) 6 tablet 0   No current facility-administered medications for this visit.     Past Medical History:  Diagnosis Date   Atrial fibrillation ([facility])    Bladder calculi    Cardiomyopathy ([facility])    Hyperlipidemia    Hypertension    Left bundle branch block    Nocturia    Osteoarthritis    Sleep apnea    Vertigo     Past Surgical History:  Procedure Laterality Date   CARDIOVERSION N/A 09/01/2019   Procedure: CARDIOVERSION;  Surgeon: Little Ishikawa, MD;  Location: [facility] ENDOSCOPY;  Service:  Cardiovascular;  Laterality: N/A;   CARDIOVERSION N/A 11/08/2019   Procedure: CARDIOVERSION;  Surgeon: Sande Rives, MD;  Location: [facility] ENDOSCOPY;  Service: Cardiovascular;  Laterality: N/A;   CYSTOSCOPY WITH LITHOLAPAXY N/A 03/08/2021   Procedure: CYSTOSCOPY WITH LITHOLAPAXY;  Surgeon: Marcine Matar, MD;  Location: [facility];  Service: Urology;  Laterality: N/A;   HERNIA REPAIR     TOTAL HIP ARTHROPLASTY Bilateral    TRANSURETHRAL INCISION OF  PROSTATE N/A 03/08/2021   Procedure: TRANSURETHRAL INCISION OF THE PROSTATE (TUIP);  Surgeon: Marcine Matar, MD;  Location: [facility];  Service: Urology;  Laterality: N/A;    Social History   Socioeconomic History   Marital status: Married    Spouse name: Not on file   Number of children: 3   Years of education: Not on file   Highest education level: Not on file  Occupational History   Not on file  Tobacco Use   Smoking status: Former   Smokeless tobacco: Never  Vaping Use   Vaping Use: Never used  Substance and Sexual Activity   Alcohol use: Yes    Alcohol/week: 1.0 standard drink of alcohol    Types: 1 Glasses of wine per week    Comment: 1-2 glasses wine per day   Drug use: No   Sexual activity: Never    Birth control/protection: Abstinence  Other Topics Concern   Not on file  Social History Narrative   Not on file   Social Determinants of Health   Financial Resource Strain: Not on file  Food Insecurity: Not on file  Transportation Needs: Not on file  Physical Activity: Not on file  Stress: Not on file  Social Connections: Not on file  Intimate Partner Violence: Not on file    Family History  Problem Relation Age of Onset   COPD Mother    Stroke Mother    Hypertension Father     ROS: Left hip pain but no fevers or chills, productive cough, hemoptysis, dysphasia, odynophagia, melena, hematochezia, dysuria, hematuria, rash, seizure activity, orthopnea, PND, claudication. Remaining systems are negative.  Physical Exam: Well-developed well-nourished in no acute distress.  Skin is warm and dry.  HEENT is normal.  Neck is supple.  Chest is clear to auscultation with normal expansion.  Cardiovascular exam is regular rate and rhythm.  Abdominal exam nontender or distended. No masses palpated. Extremities show trace edema. neuro grossly intact  ECG-normal sinus rhythm at a rate of 62, left bundle branch block.  Personally reviewed  A/P  1 paroxysmal  atrial fibrillation-patient remains in sinus rhythm.  Continue amiodarone and apixaban.  Recent liver function is normal.  Check hemoglobin, renal function and TSH.  Check chest x-ray.  2 coronary artery disease-patient doing well with no chest pain.  Continue statin.  No aspirin or need for apixaban.  3 hypertension-patient's blood pressure is controlled.  Continue present medications.  4 hyperlipidemia-continue statin.  Check lipids.  5 cardiomyopathy-patient has a history of cardiomyopathy out of proportion to coronary disease.  Continue Entresto and carvedilol.  Repeat echocardiogram.  6 obstructive sleep apnea-per Dr. Tresa Endo.  Olga Millers, MD

## 2022-09-18 DIAGNOSIS — H6123 Impacted cerumen, bilateral: Secondary | ICD-10-CM | POA: Diagnosis not present

## 2022-09-18 DIAGNOSIS — H903 Sensorineural hearing loss, bilateral: Secondary | ICD-10-CM | POA: Diagnosis not present

## 2022-09-18 DIAGNOSIS — H8190 Unspecified disorder of vestibular function, unspecified ear: Secondary | ICD-10-CM | POA: Diagnosis not present

## 2022-09-18 DIAGNOSIS — M25552 Pain in left hip: Secondary | ICD-10-CM | POA: Diagnosis not present

## 2022-09-23 ENCOUNTER — Encounter: Payer: Self-pay | Admitting: Cardiology

## 2022-09-23 ENCOUNTER — Ambulatory Visit: Payer: Medicare PPO | Attending: Cardiology | Admitting: Cardiology

## 2022-09-23 VITALS — BP 110/60 | HR 62 | Ht 72.0 in | Wt 204.0 lb

## 2022-09-23 DIAGNOSIS — I420 Dilated cardiomyopathy: Secondary | ICD-10-CM | POA: Diagnosis not present

## 2022-09-23 DIAGNOSIS — I2510 Atherosclerotic heart disease of native coronary artery without angina pectoris: Secondary | ICD-10-CM

## 2022-09-23 DIAGNOSIS — I480 Paroxysmal atrial fibrillation: Secondary | ICD-10-CM

## 2022-09-23 DIAGNOSIS — I10 Essential (primary) hypertension: Secondary | ICD-10-CM

## 2022-09-23 DIAGNOSIS — E7800 Pure hypercholesterolemia, unspecified: Secondary | ICD-10-CM

## 2022-09-23 DIAGNOSIS — M25552 Pain in left hip: Secondary | ICD-10-CM | POA: Diagnosis not present

## 2022-09-23 NOTE — Addendum Note (Signed)
Addended by: Freddi Starr on: 09/23/2022 09:49 AM   Modules accepted: Orders

## 2022-09-23 NOTE — Patient Instructions (Addendum)
  Testing/Procedures:  A chest x-ray takes a picture of the organs and structures inside the chest, including the heart, lungs, and blood vessels. This test can show several things, including, whether the heart is enlarges; whether fluid is building up in the lungs; and whether pacemaker / defibrillator leads are still in place. [id number]   Your physician has requested that you have an echocardiogram. Echocardiography is a painless test that uses sound waves to create images of your heart. It provides your doctor with information about the size and shape of your heart and how well your heart's chambers and valves are working. This procedure takes approximately one hour. There are no restrictions for this procedure. Please do NOT wear cologne, perfume, aftershave, or lotions (deodorant is allowed). Please arrive 15 minutes prior to your appointment time. [address]  Follow-Up: At [facility], you and your health needs are our priority.  As part of our continuing mission to provide you with exceptional heart care, we have created designated Provider Care Teams.  These Care Teams include your primary Cardiologist (physician) and Advanced Practice Providers (APPs -  Physician Assistants and Nurse Practitioners) who all work together to provide you with the care you need, when you need it.  We recommend signing up for the patient portal called "MyChart".  Sign up information is provided on this After Visit Summary.  MyChart is used to connect with patients for Virtual Visits (Telemedicine).  Patients are able to view lab/test results, encounter notes, upcoming appointments, etc.  Non-urgent messages can be sent to your provider as well.   To learn more about what you can do with MyChart, go to [email or web address].    Your next appointment:   6 month(s)  Provider:   Olga Millers MD

## 2022-09-25 DIAGNOSIS — M25552 Pain in left hip: Secondary | ICD-10-CM | POA: Diagnosis not present

## 2022-09-27 ENCOUNTER — Ambulatory Visit
Admission: RE | Admit: 2022-09-27 | Discharge: 2022-09-27 | Disposition: A | Discharge status: Home / Self Care | Payer: Medicare PPO | Source: Ambulatory Visit | Attending: Cardiology | Admitting: Cardiology

## 2022-09-27 DIAGNOSIS — I480 Paroxysmal atrial fibrillation: Secondary | ICD-10-CM

## 2022-09-27 DIAGNOSIS — Z79899 Other long term (current) drug therapy: Secondary | ICD-10-CM | POA: Diagnosis not present

## 2022-09-27 DIAGNOSIS — E7800 Pure hypercholesterolemia, unspecified: Secondary | ICD-10-CM | POA: Diagnosis not present

## 2022-09-28 LAB — BASIC METABOLIC PANEL
BUN/Creatinine Ratio: 19 (ref 10–24)
BUN: 21 mg/dL (ref 8–27)
CO2: 22 mmol/L (ref 20–29)
Calcium: 8.7 mg/dL (ref 8.6–10.2)
Chloride: 106 mmol/L (ref 96–106)
Creatinine, Ser: 1.08 mg/dL (ref 0.76–1.27)
Glucose: 105 mg/dL — ABNORMAL HIGH (ref 70–99)
Potassium: 4.7 mmol/L (ref 3.5–5.2)
Sodium: 141 mmol/L (ref 134–144)
eGFR: 69 mL/min/{1.73_m2} (ref >59)

## 2022-09-28 LAB — LIPID PANEL
Chol/HDL Ratio: 2.3 ratio (ref 0.0–5.0)
Cholesterol, Total: 103 mg/dL (ref 100–199)
HDL: 45 mg/dL (ref >39)
LDL Chol Calc (NIH): 47 mg/dL (ref 0–99)
Triglycerides: 43 mg/dL (ref 0–149)
VLDL Cholesterol Cal: 11 mg/dL (ref 5–40)

## 2022-09-28 LAB — TSH: TSH: 1.83 u[IU]/mL (ref 0.450–4.500)

## 2022-09-28 LAB — CBC
Hematocrit: 37.1 % — ABNORMAL LOW (ref 37.5–51.0)
Hemoglobin: 11.8 g/dL — ABNORMAL LOW (ref 13.0–17.7)
MCH: 30.7 pg (ref 26.6–33.0)
MCHC: 31.8 g/dL (ref 31.5–35.7)
MCV: 97 fL (ref 79–97)
Platelets: 194 10*3/uL (ref 150–450)
RBC: 3.84 x10E6/uL — ABNORMAL LOW (ref 4.14–5.80)
RDW: 12.5 % (ref 11.6–15.4)
WBC: 5.3 10*3/uL (ref 3.4–10.8)

## 2022-09-30 DIAGNOSIS — Z471 Aftercare following joint replacement surgery: Secondary | ICD-10-CM | POA: Diagnosis not present

## 2022-09-30 DIAGNOSIS — Z96642 Presence of left artificial hip joint: Secondary | ICD-10-CM | POA: Diagnosis not present

## 2022-10-02 DIAGNOSIS — M25552 Pain in left hip: Secondary | ICD-10-CM | POA: Diagnosis not present

## 2022-10-09 DIAGNOSIS — M25552 Pain in left hip: Secondary | ICD-10-CM | POA: Diagnosis not present

## 2022-10-11 ENCOUNTER — Other Ambulatory Visit: Payer: Self-pay | Admitting: Cardiology

## 2022-10-11 DIAGNOSIS — I420 Dilated cardiomyopathy: Secondary | ICD-10-CM

## 2022-10-23 ENCOUNTER — Ambulatory Visit (HOSPITAL_COMMUNITY): Payer: Medicare PPO | Attending: Cardiology

## 2022-10-23 DIAGNOSIS — I420 Dilated cardiomyopathy: Secondary | ICD-10-CM | POA: Diagnosis not present

## 2022-10-23 LAB — ECHOCARDIOGRAM COMPLETE
Area-P 1/2: 1.83 cm2
Est EF: 40
S' Lateral: 4.2 cm

## 2022-10-25 ENCOUNTER — Encounter: Payer: Self-pay | Admitting: *Deleted

## 2022-12-03 ENCOUNTER — Other Ambulatory Visit: Payer: Self-pay | Admitting: Cardiology

## 2022-12-19 DIAGNOSIS — B351 Tinea unguium: Secondary | ICD-10-CM | POA: Diagnosis not present

## 2022-12-19 DIAGNOSIS — L603 Nail dystrophy: Secondary | ICD-10-CM | POA: Diagnosis not present

## 2022-12-19 DIAGNOSIS — L609 Nail disorder, unspecified: Secondary | ICD-10-CM | POA: Diagnosis not present

## 2022-12-28 DIAGNOSIS — L601 Onycholysis: Secondary | ICD-10-CM | POA: Diagnosis not present

## 2023-01-23 ENCOUNTER — Other Ambulatory Visit: Payer: Self-pay | Admitting: Cardiology

## 2023-01-23 NOTE — Telephone Encounter (Signed)
Prescription refill request for Eliquis received. Indication: afib  Last office visit: crenshaw 09/23/2022 Scr: 1.08, 09/27/2022 Age: 82 yo  Weight: 92.5 kg   Refill sent.

## 2023-01-31 DIAGNOSIS — H35341 Macular cyst, hole, or pseudohole, right eye: Secondary | ICD-10-CM | POA: Diagnosis not present

## 2023-01-31 DIAGNOSIS — H40023 Open angle with borderline findings, high risk, bilateral: Secondary | ICD-10-CM | POA: Diagnosis not present

## 2023-01-31 DIAGNOSIS — H35371 Puckering of macula, right eye: Secondary | ICD-10-CM | POA: Diagnosis not present

## 2023-01-31 DIAGNOSIS — H2513 Age-related nuclear cataract, bilateral: Secondary | ICD-10-CM | POA: Diagnosis not present

## 2023-02-03 DIAGNOSIS — Z471 Aftercare following joint replacement surgery: Secondary | ICD-10-CM | POA: Diagnosis not present

## 2023-02-03 DIAGNOSIS — Z96642 Presence of left artificial hip joint: Secondary | ICD-10-CM | POA: Diagnosis not present

## 2023-02-19 DIAGNOSIS — M25552 Pain in left hip: Secondary | ICD-10-CM | POA: Diagnosis not present

## 2023-02-25 DIAGNOSIS — M25552 Pain in left hip: Secondary | ICD-10-CM | POA: Diagnosis not present

## 2023-02-27 DIAGNOSIS — M25552 Pain in left hip: Secondary | ICD-10-CM | POA: Diagnosis not present

## 2023-03-03 DIAGNOSIS — M25552 Pain in left hip: Secondary | ICD-10-CM | POA: Diagnosis not present

## 2023-03-04 DIAGNOSIS — H6123 Impacted cerumen, bilateral: Secondary | ICD-10-CM | POA: Diagnosis not present

## 2023-03-04 DIAGNOSIS — T162XXA Foreign body in left ear, initial encounter: Secondary | ICD-10-CM | POA: Diagnosis not present

## 2023-03-04 DIAGNOSIS — Z23 Encounter for immunization: Secondary | ICD-10-CM | POA: Diagnosis not present

## 2023-03-05 DIAGNOSIS — M25552 Pain in left hip: Secondary | ICD-10-CM | POA: Diagnosis not present

## 2023-03-10 DIAGNOSIS — M25552 Pain in left hip: Secondary | ICD-10-CM | POA: Diagnosis not present

## 2023-03-12 DIAGNOSIS — M25552 Pain in left hip: Secondary | ICD-10-CM | POA: Diagnosis not present

## 2023-03-13 ENCOUNTER — Other Ambulatory Visit: Payer: Self-pay | Admitting: Student

## 2023-03-17 DIAGNOSIS — M25552 Pain in left hip: Secondary | ICD-10-CM | POA: Diagnosis not present

## 2023-03-19 DIAGNOSIS — M25552 Pain in left hip: Secondary | ICD-10-CM | POA: Diagnosis not present

## 2023-03-24 ENCOUNTER — Encounter: Payer: Self-pay | Admitting: Physician Assistant

## 2023-03-24 ENCOUNTER — Other Ambulatory Visit: Payer: Self-pay | Admitting: Physician Assistant

## 2023-03-24 ENCOUNTER — Ambulatory Visit: Payer: Medicare PPO | Attending: Physician Assistant | Admitting: Physician Assistant

## 2023-03-24 VITALS — BP 138/82 | HR 83 | Ht 72.0 in | Wt 204.0 lb

## 2023-03-24 DIAGNOSIS — I4819 Other persistent atrial fibrillation: Secondary | ICD-10-CM

## 2023-03-24 DIAGNOSIS — I2510 Atherosclerotic heart disease of native coronary artery without angina pectoris: Secondary | ICD-10-CM | POA: Diagnosis not present

## 2023-03-24 DIAGNOSIS — E785 Hyperlipidemia, unspecified: Secondary | ICD-10-CM | POA: Diagnosis not present

## 2023-03-24 DIAGNOSIS — I10 Essential (primary) hypertension: Secondary | ICD-10-CM | POA: Diagnosis not present

## 2023-03-24 DIAGNOSIS — I420 Dilated cardiomyopathy: Secondary | ICD-10-CM

## 2023-03-24 NOTE — Patient Instructions (Signed)
Medication Instructions:  NO CHANGES *If you need a refill on your cardiac medications before your next appointment, please call your pharmacy*   Lab Work: NO LABS If you have labs (blood work) drawn today and your tests are completely normal, you will receive your results only by: MyChart Message (if you have MyChart) OR A paper copy in the mail If you have any lab test that is abnormal or we need to change your treatment, we will call you to review the results.   Testing/Procedures: NO TESTING   Follow-Up: At [facility], you and your health needs are our priority.  As part of our continuing mission to provide you with exceptional heart care, we have created designated Provider Care Teams.  These Care Teams include your primary Cardiologist (physician) and Advanced Practice Providers (APPs -  Physician Assistants and Nurse Practitioners) who all work together to provide you with the care you need, when you need it.   Your next appointment:   6 month(s)  Provider:   Olga Millers, MD

## 2023-03-24 NOTE — Progress Notes (Signed)
Cardiology Office Note:  .   Date:  03/24/2023  ID:  Daniel Cain, DOB 1941-01-11, MRN [id number] PCP: Daniel Pilon, FNP  [facility] Providers Cardiologist:  Daniel Millers, MD     History of Present Illness: .   Daniel Cain is a 82 y.o. male with past medical history of atrial fibrillation, CAD, OSA on CPAP, hypertension, hyperlipidemia and a history of cardiomyopathy..  Cardiac catheterization in April 2003 showed normal coronary arteries with EF 45 to 50%.  Myoview in April 2016 demonstrated EF 66%, artifact suggestive of LBBB but no ischemia.  Patient was seen by Dr. Clarene Cain with complaint of dyspnea on exertion, EKG at the time showed atrial fibrillation.  Echocardiogram in May 2021 showed EF 25 to 30%, mild RV dysfunction, mild RV enlargement, severe biatrial enlargement, mild to moderate MR and tricuspid regurgitation, mildly dilated aortic root.  Abdominal ultrasound in March 2021 showed no aneurysm.  Patient had a successful cardioversion on 09/01/2019.  However by the time he was followed up, he was back in atrial fibrillation.  He was started on amiodarone therapy and underwent DCCV again in June 2021.  A coronary CT obtained in October 2021 showed calcium score 326, 50 to 69% mid LAD lesion, 25 to 49% ramus intermedius lesion, possibly occluded left circumflex artery.  FFR was 0.76 in distal LAD suggestive of flow-limiting stenosis.  Echocardiogram in 2022 showed EF 40%, dyssynchrony due to left bundle branch block, grade 2 DD, severe LAE.  Patient was last seen by Dr. Jens Cain again April 2024 at which time he was doing well.  Repeat echocardiogram obtained on 10/23/2022 showed EF 40%, grade 2 DD, RVSP 35.9 mmHg, severe LAE, trivial MR, mild to moderate TR, mildly dilated aortic root measuring at 39 mm.  Patient presents today for follow-up.  He has been doing well since the last visit.  He often go to the [facility] near [place] to exercise.  He has been able to exercise  for 35 minutes on the stationary bicycle without any issue.  He has 2 doses of carvedilol listed, 12.5 and 6.25 mg twice a day.  I suspect he is taking the 6.25 mg twice a day, he will need to verify once he get home.  Otherwise, he has no exertional symptom, no lower extremity edema, orthopnea or PND.  He can follow-up in 6 months by Dr. Jens Cain.  ROS:   He denies chest pain, palpitations, dyspnea, pnd, orthopnea, n, v, dizziness, syncope, edema, weight gain, or early satiety. All other systems reviewed and are otherwise negative except as noted above.    Studies Reviewed: .        Cardiac Studies & Procedures   CARDIAC CATHETERIZATION  CARDIAC CATHETERIZATION 09/08/2001   STRESS TESTS  NM MYOCAR MULTI W/SPECT W 07/28/2001   ECHOCARDIOGRAM  ECHOCARDIOGRAM COMPLETE 10/23/2022  Narrative ECHOCARDIOGRAM REPORT    Patient Name:   Daniel Cain Date of Exam: 10/23/2022 Medical Rec #:  [id number]         Height:       72.0 in Accession #:    [id number]        Weight:       204.0 lb Date of Birth:  10-20-1940         BSA:          2.149 m Patient Age:    81 years          BP:  110/60 mmHg Patient Gender: M                 HR:           52 bpm. Exam Location:  [address]  Procedure: 2D Echo, Cardiac Doppler, Color Doppler and 3D Echo  Indications:    Cardiomyopathy-Unspecified I42.9  History:        Patient has prior history of Echocardiogram examinations, most recent 12/06/2020. Arrythmias:Atrial Fibrillation and LBBB; Risk Factors:Hypertension and Dyslipidemia.  Sonographer:    Daniel Cain RDCS Referring Phys: [id number] Daniel Cain  IMPRESSIONS   1. Left ventricular ejection fraction, by estimation, is 40%. The left ventricle has mild to moderately decreased function. The left ventricle demonstrates global hypokinesis. Left ventricular diastolic parameters are consistent with Grade II diastolic dysfunction (pseudonormalization). 2. Right ventricular  systolic function is mildly reduced. The right ventricular size is normal. There is normal pulmonary artery systolic pressure. The estimated right ventricular systolic pressure is 35.9 mmHg. 3. Left atrial size was severely dilated. 4. Right atrial size was moderately dilated. 5. The mitral valve is normal in structure. Trivial mitral valve regurgitation. No evidence of mitral stenosis. 6. Tricuspid valve regurgitation is mild to moderate. 7. The aortic valve is tricuspid. Aortic valve regurgitation is not visualized. No aortic stenosis is present. 8. Aortic dilatation noted. There is mild dilatation of the aortic root, measuring 39 mm. There is mild dilatation of the ascending aorta, measuring 42 mm. 9. The inferior vena cava is normal in size with greater than 50% respiratory variability, suggesting right atrial pressure of 3 mmHg.  FINDINGS Left Ventricle: Left ventricular ejection fraction, by estimation, is 40%. The left ventricle has mild to moderately decreased function. The left ventricle demonstrates global hypokinesis. The left ventricular internal cavity size was normal in size. There is no left ventricular hypertrophy. Left ventricular diastolic parameters are consistent with Grade II diastolic dysfunction (pseudonormalization).  Right Ventricle: The right ventricular size is normal. No increase in right ventricular wall thickness. Right ventricular systolic function is mildly reduced. There is normal pulmonary artery systolic pressure. The tricuspid regurgitant velocity is 2.87 m/s, and with an assumed right atrial pressure of 3 mmHg, the estimated right ventricular systolic pressure is 35.9 mmHg.  Left Atrium: Left atrial size was severely dilated.  Right Atrium: Right atrial size was moderately dilated.  Pericardium: There is no evidence of pericardial effusion.  Mitral Valve: The mitral valve is normal in structure. There is mild calcification of the mitral valve leaflet(s).  Trivial mitral valve regurgitation. No evidence of mitral valve stenosis.  Tricuspid Valve: The tricuspid valve is normal in structure. Tricuspid valve regurgitation is mild to moderate.  Aortic Valve: The aortic valve is tricuspid. Aortic valve regurgitation is not visualized. No aortic stenosis is present.  Pulmonic Valve: The pulmonic valve was normal in structure. Pulmonic valve regurgitation is mild.  Aorta: Aortic dilatation noted. There is mild dilatation of the aortic root, measuring 39 mm. There is mild dilatation of the ascending aorta, measuring 42 mm.  Venous: The inferior vena cava is normal in size with greater than 50% respiratory variability, suggesting right atrial pressure of 3 mmHg.  IAS/Shunts: No atrial level shunt detected by color flow Doppler.   LEFT VENTRICLE PLAX 2D LVIDd:         5.20 cm   Diastology LVIDs:         4.20 cm   LV e' medial:    5.35 cm/s LV PW:  1.10 cm   LV E/e' medial:  8.9 LV IVS:        1.10 cm   LV e' lateral:   12.00 cm/s LVOT diam:     2.30 cm   LV E/e' lateral: 4.0 LV SV:         96 LV SV Index:   45 LVOT Area:     4.15 cm  3D Volume EF: 3D EF:        41 % LV EDV:       139 ml LV ESV:       82 ml LV SV:        58 ml  RIGHT VENTRICLE RV Basal diam:  3.30 cm RV Mid diam:    2.40 cm RV S prime:     7.31 cm/s TAPSE (M-mode): 2.0 cm  LEFT ATRIUM              Index        RIGHT ATRIUM           Index LA diam:        5.60 cm  2.61 cm/m   RA Area:     26.60 cm LA Vol (A2C):   128.3 ml 59.70 ml/m  RA Volume:   80.10 ml  37.28 ml/m LA Vol (A4C):   117.0 ml 54.45 ml/m LA Biplane Vol: 131.0 ml 60.97 ml/m AORTIC VALVE LVOT Vmax:   91.90 cm/s LVOT Vmean:  59.600 cm/s LVOT VTI:    0.231 m  AORTA Ao Root diam: 3.90 cm Ao Asc diam:  4.20 cm  MITRAL VALVE               TRICUSPID VALVE MV Area (PHT): 1.83 cm    TR Peak grad:   32.9 mmHg MV Decel Time: 415 msec    TR Vmax:        287.00 cm/s MV E velocity: 47.80  cm/s MV A velocity: 42.60 cm/s  SHUNTS MV E/A ratio:  1.12        Systemic VTI:  0.23 m Systemic Diam: 2.30 cm  Dalton McleanMD Electronically signed by Wilfred Lacy Signature Date/Time: 10/23/2022/1:13:55 PM    Final     CT SCANS  CT CORONARY MORPH W/CTA COR W/SCORE 03/06/2020  Addendum 03/06/2020  8:39 PM ADDENDUM REPORT: 03/06/2020 20:37  HISTORY: 82 yo male with cardiomyopathy, follow up  EXAM: Cardiac/Coronary CTA  TECHNIQUE: The patient was scanned on a [employer].  PROTOCOL: A 120 kV prospective scan was triggered in the descending thoracic aorta at 111 HU's. Axial non-contrast 3 mm slices were carried out through the heart. The data set was analyzed on a dedicated work station and scored using the Agatson method. Gantry rotation speed was 250 msecs and collimation was .6 mm. Beta blockade and 0.8 mg of sl NTG was given. The 3D data set was reconstructed in 5% intervals of the 67-82 % of the R-R cycle. Diastolic phases were analyzed on a dedicated work station using MPR, MIP and VRT modes. The patient received 80mL OMNIPAQUE IOHEXOL 350 MG/ML SOLN of contrast.  FINDINGS: Quality: Very good, HR 44  Coronary calcium score: The patient's coronary artery calcium score is 326, which places the patient in the 50th percentile.  Coronary arteries: Normal coronary origins.  Right dominance.  Right Coronary Artery: Dominant. Minimal mixed 1-24% proximal and mid vessel disease (CADRADS1).  Left Main Coronary Artery: Minimal distal mixed 1-24% stenosis (CADRADS1). Trifurcates into the LAD, Ramus Intermedius and  LCx arteries.  Left Anterior Descending Coronary Artery: Moderate size anterior vessel that does not reach the apex. There is minimal 1-24% proximal mixed stenosis (CADRADS1) and moderate (to severe) 50-69% mixed mid-vessel stenosis (CADRADS3) just proximal to the D1 branch. The D1 branch is small and without disease.  Ramus Intermedius:  Large vessel that feeds the lateral wall with mild 25-49% proximal stenosis (CADRADS2).  Left Circumflex Artery: Normal caliber ostial AV groove vessel that sharply tapers and is possibly occluded or congenitally diminutive.  Aorta: Normal size, 37 mm at the mid ascending aorta (level of the PA bifurcation) measured double oblique. Aortic atherosclerosis. No dissection.  Aortic Valve: Trileaflet.  Punctate leaflet calcification.  Other findings:  Normal pulmonary vein drainage into the left atrium.  Normal left atrial appendage without a thrombus.  Normal size of the pulmonary artery.  IMPRESSION: 1. Moderate to possibly severe mixed mid-LAD stenosis and possible proximal LCx occlusion (vs congenitally diminutive given the large RI vessel), CADRADS = 3.  2. CT FFR will be performed and reported separately.  3. Coronary calcium score of 326. This was 50th percentile for age and sex matched control.  4. Normal coronary origin with right dominance.  5. Aortic atherosclerosis with punctate aortic valve leaflet calcification.   Electronically Signed By: Chrystie Nose M.D. On: 03/06/2020 20:37  Narrative EXAM: OVER-READ INTERPRETATION  CT CHEST  The following report is an over-read performed by radiologist Dr. Trudie Reed of [facility] on 03/06/2020. This over-read does not include interpretation of cardiac or coronary anatomy or pathology. The coronary calcium score/coronary CTA interpretation by the cardiologist is attached.  COMPARISON:  None.  FINDINGS: Atherosclerotic calcifications in the thoracic aorta. Within the visualized portions of the thorax there are no suspicious appearing pulmonary nodules or masses, there is no acute consolidative airspace disease, no pleural effusions, no pneumothorax and no lymphadenopathy. Visualized portions of the upper abdomen are unremarkable. There are no aggressive appearing lytic or blastic lesions  noted in the visualized portions of the skeleton.  IMPRESSION: 1.  Aortic Atherosclerosis (ICD10-I70.0).  Electronically Signed: By: Trudie Reed M.D. On: 03/06/2020 15:43          Risk Assessment/Calculations:    CHA2DS2-VASc Score = 5   This indicates a 7.2% annual risk of stroke. The patient's score is based upon: CHF History: 1 HTN History: 1 Diabetes History: 0 Stroke History: 0 Vascular Disease History: 1 Age Score: 2 Gender Score: 0            Physical Exam:   VS:  BP 138/82 (BP Location: Left Arm, Patient Position: Sitting, Cuff Size: Normal)   Pulse 83   Ht 6' (1.829 m)   Wt 204 lb (92.5 kg)   SpO2 94%   BMI 27.67 kg/m    Wt Readings from Last 3 Encounters:  03/24/23 204 lb (92.5 kg)  09/23/22 204 lb (92.5 kg)  08/26/22 201 lb (91.2 kg)    GEN: Well nourished, well developed in no acute distress NECK: No JVD; No carotid bruits CARDIAC: RRR, no murmurs, rubs, gallops RESPIRATORY:  Clear to auscultation without rales, wheezing or rhonchi  ABDOMEN: Soft, non-tender, non-distended EXTREMITIES:  No edema; No deformity   ASSESSMENT AND PLAN: .    Atrial Fibrillation Recurrent despite cardioversion and amiodarone therapy. Left atrium severely dilated, increasing risk of recurrence. No signs of amiodarone-induced interstitial fibrosis on lung exam. -Continue amiodarone therapy. -Monitor for signs of recurrence and amiodarone toxicity.  Cardiomyopathy EF 40%, unchanged from 2022.  No symptoms of heart failure reported. Patient is active with no exercise limitations. -Continue current cardiac medications including Carvedilol, Entresto, and Hydralazine.  Coronary Artery Disease Coronary CT in 2021 showed 50-69% mid LAD lesion, 25-49% ramus intermediate lesion, possibly occluded left circumflex artery. FFR was 0.76 in distal LAD suggestive of flow-limiting stenosis. No symptoms of angina reported. -Continue Lipitor for cholesterol control. -Monitor for  symptoms of angina, particularly during exercise.  Hypertension: Blood pressure well-controlled  Hyperlipidemia: On Lipitor       Dispo: Follow-up in 6 months  Signed, Azalee Course, [place]

## 2023-03-25 DIAGNOSIS — M25552 Pain in left hip: Secondary | ICD-10-CM | POA: Diagnosis not present

## 2023-03-31 DIAGNOSIS — B351 Tinea unguium: Secondary | ICD-10-CM | POA: Diagnosis not present

## 2023-03-31 DIAGNOSIS — L603 Nail dystrophy: Secondary | ICD-10-CM | POA: Diagnosis not present

## 2023-04-01 DIAGNOSIS — M25552 Pain in left hip: Secondary | ICD-10-CM | POA: Diagnosis not present

## 2023-04-03 DIAGNOSIS — M25552 Pain in left hip: Secondary | ICD-10-CM | POA: Diagnosis not present

## 2023-04-21 ENCOUNTER — Other Ambulatory Visit: Payer: Self-pay | Admitting: Cardiology

## 2023-04-22 ENCOUNTER — Other Ambulatory Visit: Payer: Self-pay | Admitting: *Deleted

## 2023-04-22 ENCOUNTER — Telehealth: Payer: Self-pay | Admitting: *Deleted

## 2023-04-22 NOTE — Telephone Encounter (Signed)
Spoke with Herbert Seta, Pharmacist at [facility] [phone], advised to disregard RX for Eliquis 5mg . Rx was sent in error from in basket.

## 2023-04-29 DIAGNOSIS — M25552 Pain in left hip: Secondary | ICD-10-CM | POA: Diagnosis not present

## 2023-05-01 DIAGNOSIS — M25552 Pain in left hip: Secondary | ICD-10-CM | POA: Diagnosis not present

## 2023-05-07 DIAGNOSIS — M25552 Pain in left hip: Secondary | ICD-10-CM | POA: Diagnosis not present

## 2023-05-12 DIAGNOSIS — M25552 Pain in left hip: Secondary | ICD-10-CM | POA: Diagnosis not present

## 2023-05-24 ENCOUNTER — Other Ambulatory Visit: Payer: Self-pay | Admitting: Cardiology

## 2023-05-24 DIAGNOSIS — E7800 Pure hypercholesterolemia, unspecified: Secondary | ICD-10-CM

## 2023-05-26 ENCOUNTER — Other Ambulatory Visit: Payer: Self-pay

## 2023-05-26 MED ORDER — APIXABAN 5 MG PO TABS: 5.0000 mg | ORAL_TABLET | Freq: Two times a day (BID) | ORAL | 11 refills | Status: AC

## 2023-06-12 ENCOUNTER — Telehealth: Payer: Self-pay | Admitting: Cardiology

## 2023-06-12 MED ORDER — ENTRESTO 97-103 MG PO TABS: 1.0000 | ORAL_TABLET | Freq: Two times a day (BID) | ORAL | 2 refills | Status: DC

## 2023-06-12 NOTE — Telephone Encounter (Signed)
Pt's medication was sent to pt's pharmacy as requested. Confirmation received.  °

## 2023-06-12 NOTE — Telephone Encounter (Signed)
*  STAT* If patient is at the pharmacy, call can be transferred to refill team.   1. Which medications need to be refilled? (please list name of each medication and dose if known)   sacubitril-valsartan (ENTRESTO) 97-103 MG    2. Which pharmacy/location (including street and city if local pharmacy) is medication to be sent to?  [facility] - [place], [place] - [address] Phone: [phone]  Fax: [phone]      3. Do they need a 30 day or 90 day supply? 90

## 2023-07-21 DIAGNOSIS — N5201 Erectile dysfunction due to arterial insufficiency: Secondary | ICD-10-CM | POA: Diagnosis not present

## 2023-07-21 DIAGNOSIS — R3915 Urgency of urination: Secondary | ICD-10-CM | POA: Diagnosis not present

## 2023-07-21 DIAGNOSIS — N401 Enlarged prostate with lower urinary tract symptoms: Secondary | ICD-10-CM | POA: Diagnosis not present

## 2023-07-21 DIAGNOSIS — R972 Elevated prostate specific antigen [PSA]: Secondary | ICD-10-CM | POA: Diagnosis not present

## 2023-08-05 DIAGNOSIS — Z96642 Presence of left artificial hip joint: Secondary | ICD-10-CM | POA: Diagnosis not present

## 2023-08-12 DIAGNOSIS — Z6829 Body mass index (BMI) 29.0-29.9, adult: Secondary | ICD-10-CM | POA: Diagnosis not present

## 2023-08-12 DIAGNOSIS — Z0000 Encounter for general adult medical examination without abnormal findings: Secondary | ICD-10-CM | POA: Diagnosis not present

## 2023-08-12 DIAGNOSIS — Z1331 Encounter for screening for depression: Secondary | ICD-10-CM | POA: Diagnosis not present

## 2023-08-22 DIAGNOSIS — I10 Essential (primary) hypertension: Secondary | ICD-10-CM | POA: Diagnosis not present

## 2023-08-22 DIAGNOSIS — R7301 Impaired fasting glucose: Secondary | ICD-10-CM | POA: Diagnosis not present

## 2023-08-22 DIAGNOSIS — Z0000 Encounter for general adult medical examination without abnormal findings: Secondary | ICD-10-CM | POA: Diagnosis not present

## 2023-08-22 DIAGNOSIS — R972 Elevated prostate specific antigen [PSA]: Secondary | ICD-10-CM | POA: Diagnosis not present

## 2023-08-22 DIAGNOSIS — E782 Mixed hyperlipidemia: Secondary | ICD-10-CM | POA: Diagnosis not present

## 2023-08-22 DIAGNOSIS — I700 Atherosclerosis of aorta: Secondary | ICD-10-CM | POA: Diagnosis not present

## 2023-08-22 DIAGNOSIS — I4891 Unspecified atrial fibrillation: Secondary | ICD-10-CM | POA: Diagnosis not present

## 2023-08-22 DIAGNOSIS — Z1211 Encounter for screening for malignant neoplasm of colon: Secondary | ICD-10-CM | POA: Diagnosis not present

## 2023-08-22 DIAGNOSIS — N401 Enlarged prostate with lower urinary tract symptoms: Secondary | ICD-10-CM | POA: Diagnosis not present

## 2023-09-09 DIAGNOSIS — D485 Neoplasm of uncertain behavior of skin: Secondary | ICD-10-CM | POA: Diagnosis not present

## 2023-09-09 DIAGNOSIS — L812 Freckles: Secondary | ICD-10-CM | POA: Diagnosis not present

## 2023-09-09 DIAGNOSIS — D225 Melanocytic nevi of trunk: Secondary | ICD-10-CM | POA: Diagnosis not present

## 2023-09-09 DIAGNOSIS — Z85828 Personal history of other malignant neoplasm of skin: Secondary | ICD-10-CM | POA: Diagnosis not present

## 2023-09-09 DIAGNOSIS — D0462 Carcinoma in situ of skin of left upper limb, including shoulder: Secondary | ICD-10-CM | POA: Diagnosis not present

## 2023-09-09 DIAGNOSIS — L821 Other seborrheic keratosis: Secondary | ICD-10-CM | POA: Diagnosis not present

## 2023-09-17 NOTE — Progress Notes (Signed)
 HPI: FU atrial fibrillation. Cardiac catheterization April 2003 showed normal coronary arteries and ejection fraction 45 to 50%. Nuclear study April 2016 showed ejection fraction 66%, artifact suggestive of left bundle branch block but no ischemia. Patient previously seen by Dr. Nathen Balder with complaints of increased dyspnea on exertion.  ECG showed atrial fibrillation.  Echocardiogram March 2021 showed ejection fraction 25 to 30%, mild RV dysfunction and mild right ventricular enlargement, severe biatrial enlargement, mild to moderate mitral regurgitation and tricuspid regurgitation and mildly dilated aortic root.  Abdominal ultrasound March 2021 showed no aneurysm. Patient had successful cardioversion on September 01, 2019. At time of follow-up he was back in atrial fibrillation.  We initiated amiodarone  and he underwent cardioversion successfully June 2021. Cardiac CTA October 2021 showed calcium  score 326, 50 to 69% mid LAD stenosis, 25 to 49% ramus intermedius stenosis and diminutive or possibly occluded left circumflex.  FFR 0.76 in the distal LAD suggestive of flow-limiting stenosis.  Echocardiogram May 2024 showed ejection fraction 40%, mild right ventricular dysfunction, moderate biatrial enlargement, mild to moderate tricuspid regurgitation, dilated ascending aorta at 42 mm.  Since last seen he denies dyspnea, chest pain, palpitations, syncope or bleeding.  Current Outpatient Medications  Medication Sig Dispense Refill   amiodarone  (PACERONE ) 200 MG tablet TAKE ONE TABLET BY MOUTH DAILY 90 tablet 3   apixaban  (ELIQUIS ) 5 MG TABS tablet Take 1 tablet (5 mg total) by mouth 2 (two) times daily. 180 tablet 11   atorvastatin  (LIPITOR) 80 MG tablet Take 1 tablet (80 mg total) by mouth daily. 3 tablet 0   Betahistine HCl (BETAHISTINE DIHYDROCHLORIDE) POWD 24mg  tablet PO twice a day     carvedilol  (COREG ) 12.5 MG tablet Take 12.5 mg by mouth 2 (two) times daily with a meal.     carvedilol  (COREG ) 6.25 MG  tablet TAKE ONE TABLET BY MOUTH TWICE DAILY AFTER MEALS 180 tablet 3   ezetimibe  (ZETIA ) 10 MG tablet TAKE ONE TABLET EVERY DAY 90 tablet 3   hydrALAZINE  (APRESOLINE ) 25 MG tablet Take 1 tablet (25 mg total) by mouth 2 (two) times daily. 180 tablet 1   sacubitril-valsartan (ENTRESTO ) 97-103 MG Take 1 tablet by mouth 2 (two) times daily. 180 tablet 2   sildenafil (VIAGRA) 100 MG tablet Take 100 mg by mouth daily as needed for erectile dysfunction.     No current facility-administered medications for this visit.     Past Medical History:  Diagnosis Date   Atrial fibrillation (HCC)    Bladder calculi    Cardiomyopathy (HCC)    Hyperlipidemia    Hypertension    Left bundle branch block    Nocturia    Osteoarthritis    Sleep apnea    Vertigo     Past Surgical History:  Procedure Laterality Date   CARDIOVERSION N/A 09/01/2019   Procedure: CARDIOVERSION;  Surgeon: Wendie Hamburg, MD;  Location: [facility] ENDOSCOPY;  Service: Cardiovascular;  Laterality: N/A;   CARDIOVERSION N/A 11/08/2019   Procedure: CARDIOVERSION;  Surgeon: Harrold Lincoln, MD;  Location: [facility]OSCOPY;  Service: Cardiovascular;  Laterality: N/A;   CYSTOSCOPY WITH LITHOLAPAXY N/A 03/08/2021   Procedure: CYSTOSCOPY WITH LITHOLAPAXY;  Surgeon: Trent Frizzle, MD;  Location: [facility] ORS;  Service: Urology;  Laterality: N/A;   HERNIA REPAIR     TOTAL HIP ARTHROPLASTY Bilateral    TRANSURETHRAL INCISION OF PROSTATE N/A 03/08/2021   Procedure: TRANSURETHRAL INCISION OF THE PROSTATE (TUIP);  Surgeon: Trent Frizzle, MD;  Location: [facility]S;  Service: Urology;  Laterality: N/A;    Social History   Socioeconomic History   Marital status: Married    Spouse name: Not on file   Number of children: 3   Years of education: Not on file   Highest education level: Not on file  Occupational History   Not on file  Tobacco Use   Smoking status: Former   Smokeless tobacco: Never  Vaping Use   Vaping status: Never Used   Substance and Sexual Activity   Alcohol use: Yes    Alcohol/week: 1.0 standard drink of alcohol    Types: 1 Glasses of wine per week    Comment: 1-2 glasses wine per day   Drug use: No   Sexual activity: Never    Birth control/protection: Abstinence  Other Topics Concern   Not on file  Social History Narrative   Not on file   Social Drivers of Health   Financial Resource Strain: Not on file  Food Insecurity: Low Risk  (05/22/2022)   [facility]   Hunger Vital Sign    Worried About Running Out of Food in the Last Year: Never true    Within the past 12 months, the food you bought just didn't last and you didn't have money to get more: Not on file  Transportation Needs: No Transportation Needs (05/22/2022)   Received from [facility] visits prior to 07/27/2022., [facility] visits prior to 07/27/2022.   Transportation    In the past 12 months, has lack of reliable transportation kept you from medical appointments, meetings, work or from getting things needed for daily living?: No  Physical Activity: Not on file  Stress: Not on file  Social Connections: Not on file  Intimate Partner Violence: Low Risk  (05/22/2022)   [facility] visits prior to 07/27/2022., [facility] visits prior to 07/27/2022.   Safety    How often does anyone, including family and friends, physically hurt you?: Never    How often does anyone, including family and friends, insult or talk down to you?: Never    How often does anyone, including family and friends, threaten you with harm?: Never    How often does anyone, including family and friends, scream or curse at you?: Never    Family History  Problem Relation Age of Onset   COPD Mother    Stroke Mother    Hypertension Father     ROS: no fevers or chills, productive cough, hemoptysis, dysphasia, odynophagia, melena, hematochezia,  dysuria, hematuria, rash, seizure activity, orthopnea, PND, pedal edema, claudication. Remaining systems are negative.  Physical Exam: Well-developed well-nourished in no acute distress.  Skin is warm and dry.  HEENT is normal.  Neck is supple.  Chest is clear to auscultation with normal expansion.  Cardiovascular exam is regular rate and rhythm.  Abdominal exam nontender or distended. No masses palpated. Extremities show no edema. neuro grossly intact   A/P  1 paroxysmal atrial fibrillation-patient remains in sinus rhythm.  Continue amiodarone  and apixaban .  Check hemoglobin, renal function, liver functions, TSH and chest x-ray.  2 coronary artery disease-patient denies chest pain.  Continue statin.  He is not on aspirin given need for anticoagulation.  3 hypertension-blood pressure controlled.  Continue present medical regimen.  4 hyperlipidemia-continue statin.  Check lipids and liver.  5 Cardiomyopathy-felt to be out of proportion to coronary disease.  Continue Entresto  and carvedilol .  Add spironolactone  12.5 mg daily.  Check potassium and renal function in 1 week.  Most recent echocardiogram showed ejection fraction 40% and unchanged.  Will repeat study.  6 obstructive sleep apnea-managed by Dr. Loetta Ringer.  Alexandria Angel, MD

## 2023-09-18 DIAGNOSIS — H6121 Impacted cerumen, right ear: Secondary | ICD-10-CM | POA: Diagnosis not present

## 2023-09-18 DIAGNOSIS — H6120 Impacted cerumen, unspecified ear: Secondary | ICD-10-CM | POA: Diagnosis not present

## 2023-09-18 DIAGNOSIS — R053 Chronic cough: Secondary | ICD-10-CM | POA: Diagnosis not present

## 2023-09-18 DIAGNOSIS — H6692 Otitis media, unspecified, left ear: Secondary | ICD-10-CM | POA: Diagnosis not present

## 2023-09-21 ENCOUNTER — Other Ambulatory Visit: Payer: Self-pay | Admitting: Cardiology

## 2023-09-24 DIAGNOSIS — H81399 Other peripheral vertigo, unspecified ear: Secondary | ICD-10-CM | POA: Diagnosis not present

## 2023-09-24 DIAGNOSIS — H903 Sensorineural hearing loss, bilateral: Secondary | ICD-10-CM | POA: Diagnosis not present

## 2023-09-30 ENCOUNTER — Other Ambulatory Visit (HOSPITAL_COMMUNITY)

## 2023-09-30 ENCOUNTER — Ambulatory Visit: Payer: Medicare PPO | Attending: Cardiology | Admitting: Cardiology

## 2023-09-30 ENCOUNTER — Encounter: Payer: Self-pay | Admitting: Cardiology

## 2023-09-30 VITALS — BP 134/82 | HR 50 | Ht 72.0 in | Wt 203.0 lb

## 2023-09-30 DIAGNOSIS — I480 Paroxysmal atrial fibrillation: Secondary | ICD-10-CM | POA: Diagnosis not present

## 2023-09-30 DIAGNOSIS — E785 Hyperlipidemia, unspecified: Secondary | ICD-10-CM | POA: Diagnosis not present

## 2023-09-30 DIAGNOSIS — I2510 Atherosclerotic heart disease of native coronary artery without angina pectoris: Secondary | ICD-10-CM | POA: Diagnosis not present

## 2023-09-30 DIAGNOSIS — I420 Dilated cardiomyopathy: Secondary | ICD-10-CM

## 2023-09-30 DIAGNOSIS — I10 Essential (primary) hypertension: Secondary | ICD-10-CM | POA: Diagnosis not present

## 2023-09-30 MED ORDER — SPIRONOLACTONE 25 MG PO TABS: 12.5000 mg | ORAL_TABLET | Freq: Every day | ORAL | 3 refills | Status: AC

## 2023-09-30 NOTE — Patient Instructions (Signed)
 Medication Instructions:   START SPIRONOLACTONE 12.5 MG ONCE DAILY= 1/2 OF THE 25 MG TABLET ONCE DAILY  *If you need a refill on your cardiac medications before your next appointment, please call your pharmacy*  Lab Work:  Your physician recommends that you return for lab work in: [facility]  If you have labs (blood work) drawn today and your tests are completely normal, you will receive your results only by: MyChart Message (if you have MyChart) OR A paper copy in the mail If you have any lab test that is abnormal or we need to change your treatment, we will call you to review the results.  Testing/Procedures:  Your physician has requested that you have an echocardiogram. Echocardiography is a painless test that uses sound waves to create images of your heart. It provides your doctor with information about the size and shape of your heart and how well your heart's chambers and valves are working. This procedure takes approximately one hour. There are no restrictions for this procedure. Please do NOT wear cologne, perfume, aftershave, or lotions (deodorant is allowed). Please arrive 15 minutes prior to your appointment time.  Please note: We ask at that you not bring children with you during ultrasound (echo/ vascular) testing. Due to room size and safety concerns, children are not allowed in the ultrasound rooms during exams. Our front office staff cannot provide observation of children in our lobby area while testing is being conducted. An adult accompanying a patient to their appointment will only be allowed in the ultrasound room at the discretion of the ultrasound technician under special circumstances. We apologize for any inconvenience. [address]   A chest x-ray takes a picture of the organs and structures inside the chest, including the heart, lungs, and blood vessels. This test can show several things, including, whether the heart is enlarges; whether fluid is building up  in the lungs; and whether pacemaker / defibrillator leads are still in place. 2 [address] FLOOR  Follow-Up: At [facility], you and your health needs are our priority.  As part of our continuing mission to provide you with exceptional heart care, our providers are all part of one team.  This team includes your primary Cardiologist (physician) and Advanced Practice Providers or APPs (Physician Assistants and Nurse Practitioners) who all work together to provide you with the care you need, when you need it.  Your next appointment:   6 month(s)  Provider:   Alexandria Angel, MD

## 2023-10-07 ENCOUNTER — Ambulatory Visit (HOSPITAL_COMMUNITY)

## 2023-10-07 DIAGNOSIS — H40023 Open angle with borderline findings, high risk, bilateral: Secondary | ICD-10-CM | POA: Diagnosis not present

## 2023-10-07 DIAGNOSIS — H2513 Age-related nuclear cataract, bilateral: Secondary | ICD-10-CM | POA: Diagnosis not present

## 2023-10-07 DIAGNOSIS — H25013 Cortical age-related cataract, bilateral: Secondary | ICD-10-CM | POA: Diagnosis not present

## 2023-10-09 ENCOUNTER — Ambulatory Visit (HOSPITAL_COMMUNITY)
Admission: RE | Admit: 2023-10-09 | Discharge: 2023-10-09 | Disposition: A | Discharge status: Home / Self Care | Source: Ambulatory Visit | Attending: Cardiovascular Disease | Admitting: Cardiovascular Disease

## 2023-10-09 DIAGNOSIS — E785 Hyperlipidemia, unspecified: Secondary | ICD-10-CM | POA: Diagnosis not present

## 2023-10-09 DIAGNOSIS — I480 Paroxysmal atrial fibrillation: Secondary | ICD-10-CM | POA: Insufficient documentation

## 2023-10-09 DIAGNOSIS — J984 Other disorders of lung: Secondary | ICD-10-CM | POA: Diagnosis not present

## 2023-10-09 LAB — CBC

## 2023-10-10 ENCOUNTER — Ambulatory Visit: Payer: Self-pay | Admitting: Cardiology

## 2023-10-10 LAB — COMPREHENSIVE METABOLIC PANEL WITH GFR
ALT: 18 IU/L (ref 0–44)
AST: 20 IU/L (ref 0–40)
Albumin: 4 g/dL (ref 3.7–4.7)
Alkaline Phosphatase: 80 IU/L (ref 44–121)
BUN/Creatinine Ratio: 17 (ref 10–24)
BUN: 19 mg/dL (ref 8–27)
Bilirubin Total: 0.6 mg/dL (ref 0.0–1.2)
CO2: 22 mmol/L (ref 20–29)
Calcium: 8.6 mg/dL (ref 8.6–10.2)
Chloride: 104 mmol/L (ref 96–106)
Creatinine, Ser: 1.09 mg/dL (ref 0.76–1.27)
Globulin, Total: 2.1 g/dL (ref 1.5–4.5)
Glucose: 96 mg/dL (ref 70–99)
Potassium: 4.4 mmol/L (ref 3.5–5.2)
Sodium: 138 mmol/L (ref 134–144)
Total Protein: 6.1 g/dL (ref 6.0–8.5)
eGFR: 68 mL/min/{1.73_m2} (ref >59)

## 2023-10-10 LAB — CBC
Hematocrit: 39.4 % (ref 37.5–51.0)
Hemoglobin: 12.7 g/dL — ABNORMAL LOW (ref 13.0–17.7)
MCH: 31.4 pg (ref 26.6–33.0)
MCHC: 32.2 g/dL (ref 31.5–35.7)
MCV: 98 fL — ABNORMAL HIGH (ref 79–97)
Platelets: 165 10*3/uL (ref 150–450)
RBC: 4.04 x10E6/uL — ABNORMAL LOW (ref 4.14–5.80)
RDW: 12.2 % (ref 11.6–15.4)
WBC: 6.6 10*3/uL (ref 3.4–10.8)

## 2023-10-10 LAB — LIPID PANEL
Chol/HDL Ratio: 2.6 ratio (ref 0.0–5.0)
Cholesterol, Total: 108 mg/dL (ref 100–199)
HDL: 42 mg/dL (ref >39)
LDL Chol Calc (NIH): 53 mg/dL (ref 0–99)
Triglycerides: 55 mg/dL (ref 0–149)
VLDL Cholesterol Cal: 13 mg/dL (ref 5–40)

## 2023-10-10 LAB — TSH: TSH: 1.69 u[IU]/mL (ref 0.450–4.500)

## 2023-10-20 ENCOUNTER — Other Ambulatory Visit: Payer: Self-pay | Admitting: Cardiology

## 2023-10-20 DIAGNOSIS — I420 Dilated cardiomyopathy: Secondary | ICD-10-CM

## 2023-11-06 ENCOUNTER — Other Ambulatory Visit: Payer: Self-pay | Admitting: Urology

## 2023-11-06 ENCOUNTER — Ambulatory Visit (HOSPITAL_COMMUNITY)
Admission: RE | Admit: 2023-11-06 | Discharge: 2023-11-06 | Disposition: A | Discharge status: Home / Self Care | Source: Ambulatory Visit | Attending: Cardiology | Admitting: Cardiology

## 2023-11-06 DIAGNOSIS — I420 Dilated cardiomyopathy: Secondary | ICD-10-CM

## 2023-11-06 LAB — ECHOCARDIOGRAM COMPLETE
Area-P 1/2: 2.61 cm2
S' Lateral: 4.4 cm

## 2023-11-07 ENCOUNTER — Other Ambulatory Visit: Payer: Self-pay | Admitting: Urology

## 2023-12-06 DIAGNOSIS — Z6826 Body mass index (BMI) 26.0-26.9, adult: Secondary | ICD-10-CM | POA: Diagnosis not present

## 2023-12-06 DIAGNOSIS — H6120 Impacted cerumen, unspecified ear: Secondary | ICD-10-CM | POA: Diagnosis not present

## 2023-12-22 ENCOUNTER — Other Ambulatory Visit: Payer: Self-pay | Admitting: Cardiology

## 2024-02-09 ENCOUNTER — Other Ambulatory Visit: Payer: Self-pay | Admitting: Cardiology

## 2024-03-02 DIAGNOSIS — Z6826 Body mass index (BMI) 26.0-26.9, adult: Secondary | ICD-10-CM | POA: Diagnosis not present

## 2024-03-02 DIAGNOSIS — H6123 Impacted cerumen, bilateral: Secondary | ICD-10-CM | POA: Diagnosis not present

## 2024-03-09 DIAGNOSIS — H35341 Macular cyst, hole, or pseudohole, right eye: Secondary | ICD-10-CM | POA: Diagnosis not present

## 2024-03-09 DIAGNOSIS — H5213 Myopia, bilateral: Secondary | ICD-10-CM | POA: Diagnosis not present

## 2024-03-09 DIAGNOSIS — H40023 Open angle with borderline findings, high risk, bilateral: Secondary | ICD-10-CM | POA: Diagnosis not present

## 2024-03-09 DIAGNOSIS — H524 Presbyopia: Secondary | ICD-10-CM | POA: Diagnosis not present

## 2024-03-09 DIAGNOSIS — H43813 Vitreous degeneration, bilateral: Secondary | ICD-10-CM | POA: Diagnosis not present

## 2024-03-09 DIAGNOSIS — H35371 Puckering of macula, right eye: Secondary | ICD-10-CM | POA: Diagnosis not present

## 2024-03-09 DIAGNOSIS — H25013 Cortical age-related cataract, bilateral: Secondary | ICD-10-CM | POA: Diagnosis not present

## 2024-03-27 ENCOUNTER — Other Ambulatory Visit: Payer: Self-pay | Admitting: Cardiology

## 2024-06-01 ENCOUNTER — Other Ambulatory Visit: Payer: Self-pay | Admitting: Cardiology

## 2024-06-01 DIAGNOSIS — E7800 Pure hypercholesterolemia, unspecified: Secondary | ICD-10-CM

## 2024-07-15 ENCOUNTER — Ambulatory Visit: Admitting: Cardiology
